# Patient Record
Sex: Male | Born: 1955 | Race: White | Hispanic: No | Marital: Married | Smoking: Never smoker
Health system: Southern US, Community
[De-identification: ages and names within clinical notes are randomized; demographics above are authoritative.]

## PROBLEM LIST (undated history)

## (undated) DIAGNOSIS — T4145XA Adverse effect of unspecified anesthetic, initial encounter: Secondary | ICD-10-CM

## (undated) DIAGNOSIS — M1990 Unspecified osteoarthritis, unspecified site: Secondary | ICD-10-CM

## (undated) DIAGNOSIS — R112 Nausea with vomiting, unspecified: Secondary | ICD-10-CM

## (undated) DIAGNOSIS — I10 Essential (primary) hypertension: Secondary | ICD-10-CM

## (undated) DIAGNOSIS — Z9889 Other specified postprocedural states: Secondary | ICD-10-CM

## (undated) DIAGNOSIS — E785 Hyperlipidemia, unspecified: Secondary | ICD-10-CM

## (undated) DIAGNOSIS — M1712 Unilateral primary osteoarthritis, left knee: Principal | ICD-10-CM

## (undated) DIAGNOSIS — T8859XA Other complications of anesthesia, initial encounter: Secondary | ICD-10-CM

## (undated) DIAGNOSIS — I493 Ventricular premature depolarization: Secondary | ICD-10-CM

## (undated) DIAGNOSIS — R972 Elevated prostate specific antigen [PSA]: Secondary | ICD-10-CM

## (undated) DIAGNOSIS — E119 Type 2 diabetes mellitus without complications: Secondary | ICD-10-CM

## (undated) DIAGNOSIS — K641 Second degree hemorrhoids: Principal | ICD-10-CM

## (undated) HISTORY — DX: Elevated prostate specific antigen (PSA): R97.20

## (undated) HISTORY — PX: EXTERNAL EAR SURGERY: SHX627

## (undated) HISTORY — PX: SIGMOIDOSCOPY: SUR1295

## (undated) HISTORY — DX: Essential (primary) hypertension: I10

## (undated) HISTORY — PX: NASAL FRACTURE SURGERY: SHX718

## (undated) HISTORY — PX: TONSILLECTOMY: SUR1361

## (undated) HISTORY — PX: KNEE SURGERY: SHX244

## (undated) HISTORY — DX: Type 2 diabetes mellitus without complications: E11.9

## (undated) HISTORY — DX: Second degree hemorrhoids: K64.1

## (undated) HISTORY — PX: CARPAL TUNNEL RELEASE: SHX101

## (undated) HISTORY — DX: Unspecified osteoarthritis, unspecified site: M19.90

## (undated) HISTORY — DX: Hyperlipidemia, unspecified: E78.5

---

## 1999-02-08 ENCOUNTER — Other Ambulatory Visit: Admission: RE | Admit: 1999-02-08 | Discharge: 1999-02-08 | Payer: Self-pay | Admitting: *Deleted

## 2006-09-07 ENCOUNTER — Ambulatory Visit: Payer: Self-pay | Admitting: Family Medicine

## 2007-02-17 ENCOUNTER — Ambulatory Visit: Payer: Self-pay | Admitting: Family Medicine

## 2008-12-29 ENCOUNTER — Ambulatory Visit: Payer: Self-pay | Admitting: Family Medicine

## 2011-04-04 NOTE — Assessment & Plan Note (Signed)
[facility]                              [facility] OFFICE NOTE   NAME:[name], Donald [name]                       MRN:          [id number]  DATE:09/07/2006                            DOB:          02-28-1956    This is a 55 year old gentleman here to establish with our practice,  complaining of left groin pain.  There is no history of trauma.  He has  never had a problem like this before.  It came on suddenly about 10 days ago  while he was sitting in a lawn chair watching his daughter play in a soccer  game.  He developed a sudden sharp burning-type pain in the left groin.  At  first it radiated towards the left testicle, but primarily involved the  groin region.  Since then, he has had it intermittently, usually just for a  few seconds at a time, although it may last as long as 20-30 minutes at a  time.  It no longer involves the testicle, but simply feels like a pain in  the groin region.  There have been no lumps or swelling that he could tell.  No urinary symptoms at all.  No penile discharge.  He has taken Tylenol for  it a couple of times, but usually nothing.  He has continued to work as  normally.   PAST MEDICAL HISTORY:  He had surgery to repair a broken nose.  He has had a  tonsillectomy.  He also was worked up for blood in his stools in 1986 with a  sigmoidoscopy.  This revealed simple hemorrhoids only.   HABITS:  He does not use tobacco or alcohol.   ALLERGIES:  None.   MEDICATIONS:  None.   SOCIAL HISTORY:  He is married.  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   FAMILY HISTORY:  Remarkable for arthritis, breast cancer, lung cancer, high  cholesterol, and heart disease.   IMMUNIZATIONS:  He had a tetanus booster in 2005.   OBJECTIVE:  Height 5 feet 5 inches.  Weight 210.  BP 152/92, pulse 92 and  regular, temperature 99.0 degrees.  GENERAL:  He is in  no acute distress.  He gets up and down from the  examination table easily.  In fact, nothing we do in the exam room today  either by himself or with my intervention can reproduce his pain.  He is  overweight.  GENITALIA:  Normal male.  Testicles are not swollen or twisted, or tender at  all.  Examination of both groins revealed no masses, no hernias, and I can  elicit no tenderness at all on either side.   ASSESSMENT AND PLAN:  1. Groin pain, probably muscular in origin.  I think this will get better      over time.  He can rest.  We will begin Lodine 500 mg b.i.d. over the      next couple weeks to see if this can help.  If he is no better in the  next 2 or 3 weeks he is to follow up with me.  2. Health maintenance.  We will have him come by soon for fasting      laboratories and a complete physical exam, since he has not had one in      many years.            ______________________________  Tera Mater Clent Ridges, MD     SAF/MedQ  DD:  09/07/2006  DT:  09/08/2006  Job #:  [id number]

## 2012-02-09 ENCOUNTER — Ambulatory Visit (INDEPENDENT_AMBULATORY_CARE_PROVIDER_SITE_OTHER): Payer: No Typology Code available for payment source | Admitting: Family Medicine

## 2012-02-09 ENCOUNTER — Encounter: Payer: Self-pay | Admitting: Family Medicine

## 2012-02-09 VITALS — BP 130/76 | HR 97 | Temp 98.8°F | Wt 214.0 lb

## 2012-02-09 DIAGNOSIS — J329 Chronic sinusitis, unspecified: Secondary | ICD-10-CM

## 2012-02-09 MED ORDER — HYDROCOD POLST-CHLORPHEN POLST 10-8 MG/5ML PO LQCR: 5.0000 mL | Freq: Two times a day (BID) | ORAL | Status: DC | PRN

## 2012-02-09 MED ORDER — LEVOFLOXACIN 500 MG PO TABS: 500.0000 mg | ORAL_TABLET | Freq: Every day | ORAL | Status: AC

## 2012-02-09 NOTE — Progress Notes (Signed)
  Subjective:    Patient ID: Donald [name], male    DOB: [birth year], 56 y.o.   MRN: [id number]  HPI Here for one week of sinus pressure, PND, ST, and coughing up yellow sputum. On Mucinex D.    Review of Systems  Constitutional: Negative.   HENT: Positive for congestion, postnasal drip and sinus pressure.   Eyes: Negative.   Respiratory: Positive for cough.        Objective:   Physical Exam  Constitutional: He appears well-developed and well-nourished.  HENT:  Right Ear: External ear normal.  Left Ear: External ear normal.  Nose: Nose normal.  Mouth/Throat: Oropharynx is clear and moist. No oropharyngeal exudate.  Eyes: Conjunctivae are normal.  Pulmonary/Chest: Effort normal and breath sounds normal.  Lymphadenopathy:    He has no cervical adenopathy.          Assessment & Plan:  Recheck prn

## 2013-12-20 ENCOUNTER — Telehealth: Payer: Self-pay | Admitting: Family Medicine

## 2013-12-20 NOTE — Telephone Encounter (Signed)
Patient Information:  Caller Name: Donald [name]  Phone: [phone]  Patient: Donald [name], Donald [name]  Gender: Male  DOB: September 13, 1956  Age: 58 Years  PCP: Alysia Penna [facility])  Office Follow Up:  Does the office need to follow up with this patient?: No  Instructions For The Office: N/A  RN Note:  wife says Donald [name] is out of town today; requesting appt 12/21/13  Symptoms  Reason For Call & Symptoms: Wife says he had probable flu at the beginning of January; cough has worsened; seen at the UC over the w/e and placed on Amoxicillin an a cough med; says his left side is hurting from coughing; cough is productive, but unsure of the color  Reviewed Health History In EMR: Yes  Reviewed Medications In EMR: Yes  Reviewed Allergies In EMR: Yes  Reviewed Surgeries / Procedures: Yes  Date of Onset of Symptoms: Unknown  Guideline(s) Used:  Cough  Disposition Per Guideline:   See Today in Office  Reason For Disposition Reached:   Severe coughing spells (e.g., whooping sound after coughing, vomiting after coughing)  Advice Given:  N/A  Patient Will Follow Care Advice:  YES  Appointment Scheduled:  12/21/2013 11:00:00 Appointment Scheduled Provider:  Alysia Penna [facility])

## 2013-12-20 NOTE — Telephone Encounter (Signed)
FYI

## 2013-12-21 ENCOUNTER — Encounter: Payer: Self-pay | Admitting: Family Medicine

## 2013-12-21 ENCOUNTER — Ambulatory Visit (INDEPENDENT_AMBULATORY_CARE_PROVIDER_SITE_OTHER): Payer: Managed Care, Other (non HMO) | Admitting: Family Medicine

## 2013-12-21 VITALS — BP 150/86 | HR 82 | Temp 98.5°F | Ht 65.0 in | Wt 218.0 lb

## 2013-12-21 DIAGNOSIS — J209 Acute bronchitis, unspecified: Secondary | ICD-10-CM

## 2013-12-21 MED ORDER — LEVOFLOXACIN 500 MG PO TABS: 500.0000 mg | ORAL_TABLET | Freq: Every day | ORAL | Status: AC

## 2013-12-21 MED ORDER — HYDROCOD POLST-CHLORPHEN POLST 10-8 MG/5ML PO LQCR: 5.0000 mL | Freq: Two times a day (BID) | ORAL | Status: DC | PRN

## 2013-12-21 NOTE — Progress Notes (Signed)
Pre visit review using our clinic review tool, if applicable. No additional management support is needed unless otherwise documented below in the visit note. 

## 2013-12-21 NOTE — Progress Notes (Signed)
   Subjective:    Patient ID: Donald [name], male    DOB: 1956/08/28, 58 y.o.   MRN: [id number]  HPI Here for 2 weeks of chest congestion and coughing up grayish sputum. No fever. He went to an Urgent Care last week and was given Augmentin. This helped but he is still coughing.    Review of Systems  Constitutional: Negative.   HENT: Negative.   Eyes: Negative.   Respiratory: Positive for cough and chest tightness.        Objective:   Physical Exam  Constitutional: He appears well-developed and well-nourished.  HENT:  Right Ear: External ear normal.  Left Ear: External ear normal.  Nose: Nose normal.  Mouth/Throat: Oropharynx is clear and moist.  Eyes: Conjunctivae are normal.  Pulmonary/Chest: Effort normal. No respiratory distress. He has no wheezes. He has no rales.  Scattered rhonchi   Lymphadenopathy:    He has no cervical adenopathy.          Assessment & Plan:  Recheck prn

## 2016-11-26 ENCOUNTER — Other Ambulatory Visit (INDEPENDENT_AMBULATORY_CARE_PROVIDER_SITE_OTHER): Payer: Managed Care, Other (non HMO)

## 2016-11-26 DIAGNOSIS — Z0000 Encounter for general adult medical examination without abnormal findings: Secondary | ICD-10-CM

## 2016-11-26 DIAGNOSIS — R7989 Other specified abnormal findings of blood chemistry: Secondary | ICD-10-CM

## 2016-11-26 LAB — POC URINALSYSI DIPSTICK (AUTOMATED)
Bilirubin, UA: NEGATIVE
Ketones, UA: NEGATIVE
LEUKOCYTES UA: NEGATIVE
NITRITE UA: NEGATIVE
PH UA: 5.5
Protein, UA: NEGATIVE
Spec Grav, UA: 1.015
UROBILINOGEN UA: 0.2

## 2016-11-26 LAB — PSA: PSA: 4.28 ng/mL — AB (ref 0.10–4.00)

## 2016-11-26 LAB — HEPATIC FUNCTION PANEL
ALBUMIN: 4.2 g/dL (ref 3.5–5.2)
ALK PHOS: 64 U/L (ref 39–117)
ALT: 41 U/L (ref 0–53)
AST: 29 U/L (ref 0–37)
Bilirubin, Direct: 0.1 mg/dL (ref 0.0–0.3)
TOTAL PROTEIN: 6.7 g/dL (ref 6.0–8.3)
Total Bilirubin: 0.7 mg/dL (ref 0.2–1.2)

## 2016-11-26 LAB — BASIC METABOLIC PANEL
BUN: 11 mg/dL (ref 6–23)
CO2: 25 meq/L (ref 19–32)
Calcium: 9.2 mg/dL (ref 8.4–10.5)
Chloride: 104 mEq/L (ref 96–112)
Creatinine, Ser: 0.93 mg/dL (ref 0.40–1.50)
GFR: 88.38 mL/min (ref >60.00)
GLUCOSE: 142 mg/dL — AB (ref 70–99)
POTASSIUM: 3.9 meq/L (ref 3.5–5.1)
SODIUM: 137 meq/L (ref 135–145)

## 2016-11-26 LAB — CBC WITH DIFFERENTIAL/PLATELET
Basophils Absolute: 0 10*3/uL (ref 0.0–0.1)
Basophils Relative: 0.5 % (ref 0.0–3.0)
EOS PCT: 1.5 % (ref 0.0–5.0)
Eosinophils Absolute: 0.1 10*3/uL (ref 0.0–0.7)
HEMATOCRIT: 44.2 % (ref 39.0–52.0)
Hemoglobin: 15.3 g/dL (ref 13.0–17.0)
LYMPHS ABS: 1.7 10*3/uL (ref 0.7–4.0)
LYMPHS PCT: 29.7 % (ref 12.0–46.0)
MCHC: 34.5 g/dL (ref 30.0–36.0)
MCV: 89.3 fl (ref 78.0–100.0)
MONOS PCT: 7 % (ref 3.0–12.0)
Monocytes Absolute: 0.4 10*3/uL (ref 0.1–1.0)
NEUTROS PCT: 61.3 % (ref 43.0–77.0)
Neutro Abs: 3.6 10*3/uL (ref 1.4–7.7)
Platelets: 192 10*3/uL (ref 150.0–400.0)
RBC: 4.95 Mil/uL (ref 4.22–5.81)
RDW: 13.1 % (ref 11.5–15.5)
WBC: 5.9 10*3/uL (ref 4.0–10.5)

## 2016-11-26 LAB — LIPID PANEL
CHOLESTEROL: 194 mg/dL (ref 0–200)
HDL: 29.2 mg/dL — ABNORMAL LOW (ref >39.00)
NonHDL: 164.57
Total CHOL/HDL Ratio: 7
Triglycerides: 354 mg/dL — ABNORMAL HIGH (ref 0.0–149.0)
VLDL: 70.8 mg/dL — AB (ref 0.0–40.0)

## 2016-11-26 LAB — LDL CHOLESTEROL, DIRECT: LDL DIRECT: 98 mg/dL

## 2016-11-26 LAB — TSH: TSH: 2.15 u[IU]/mL (ref 0.35–4.50)

## 2016-12-01 ENCOUNTER — Ambulatory Visit (INDEPENDENT_AMBULATORY_CARE_PROVIDER_SITE_OTHER): Payer: Managed Care, Other (non HMO) | Admitting: Family Medicine

## 2016-12-01 ENCOUNTER — Encounter: Payer: Self-pay | Admitting: Family Medicine

## 2016-12-01 VITALS — BP 190/92 | HR 100 | Temp 98.5°F | Ht 65.0 in | Wt 218.0 lb

## 2016-12-01 DIAGNOSIS — Z0000 Encounter for general adult medical examination without abnormal findings: Secondary | ICD-10-CM | POA: Diagnosis not present

## 2016-12-01 DIAGNOSIS — R739 Hyperglycemia, unspecified: Secondary | ICD-10-CM | POA: Diagnosis not present

## 2016-12-01 DIAGNOSIS — R972 Elevated prostate specific antigen [PSA]: Secondary | ICD-10-CM | POA: Diagnosis not present

## 2016-12-01 LAB — HEMOGLOBIN A1C: Hgb A1c MFr Bld: 8.1 % — ABNORMAL HIGH (ref 4.6–6.5)

## 2016-12-01 MED ORDER — CEPHALEXIN 500 MG PO CAPS: 500.0000 mg | ORAL_CAPSULE | Freq: Four times a day (QID) | ORAL | 0 refills | Status: DC

## 2016-12-01 MED ORDER — LISINOPRIL 20 MG PO TABS: 20.0000 mg | ORAL_TABLET | Freq: Every day | ORAL | 3 refills | Status: DC

## 2016-12-01 NOTE — Progress Notes (Signed)
   Subjective:    Patient ID: Donald [name], male    DOB: 08-07-1956, 61 y.o.   MRN: [id number]  HPI 61 yr old male for a well exam. He feels fine except for infection on the tip of the right 4th finger. 3 weeks ago he shut the finger in a door and it has been sore and swollen since then.    Review of Systems  Constitutional: Negative.   HENT: Negative.   Eyes: Negative.   Respiratory: Negative.   Cardiovascular: Negative.   Gastrointestinal: Negative.   Genitourinary: Negative.   Musculoskeletal: Negative.   Skin: Positive for wound.  Neurological: Negative.   Psychiatric/Behavioral: Negative.        Objective:   Physical Exam  Constitutional: He is oriented to person, place, and time. He appears well-developed and well-nourished. No distress.  HENT:  Head: Normocephalic and atraumatic.  Right Ear: External ear normal.  Left Ear: External ear normal.  Nose: Nose normal.  Mouth/Throat: Oropharynx is clear and moist. No oropharyngeal exudate.  Eyes: Conjunctivae and EOM are normal. Pupils are equal, round, and reactive to light. Right eye exhibits no discharge. Left eye exhibits no discharge. No scleral icterus.  Neck: Neck supple. No JVD present. No tracheal deviation present. No thyromegaly present.  Cardiovascular: Normal rate, regular rhythm, normal heart sounds and intact distal pulses.  Exam reveals no gallop and no friction rub.   No murmur heard. Pulmonary/Chest: Effort normal and breath sounds normal. No respiratory distress. He has no wheezes. He has no rales. He exhibits no tenderness.  Abdominal: Soft. Bowel sounds are normal. He exhibits no distension and no mass. There is no tenderness. There is no rebound and no guarding.  Genitourinary: Rectum normal, prostate normal and penis normal. Rectal exam shows guaiac negative stool. No penile tenderness.  Musculoskeletal: Normal range of motion. He exhibits no edema or tenderness.  Lymphadenopathy:    He has no cervical  adenopathy.  Neurological: He is alert and oriented to person, place, and time. He has normal reflexes. No cranial nerve deficit. He exhibits normal muscle tone. Coordination normal.  Skin: Skin is warm and dry. No rash noted. He is not diaphoretic. No erythema. No pallor.  The area around the nail of the right 4th finger is red and swollen and tender  Psychiatric: He has a normal mood and affect. His behavior is normal. Judgment and thought content normal.          Assessment & Plan:  Well exam. We discussed diet and exercise. Treat the paronychia with Keflex. Set up a colonoscopy. He has HTN so we will treat with Lisinopril 20 mg daily. Recheck in 3-4 weks. His fasting glucose is elevated so we will check an A1c today. Refer to Urology for the elevate PSA. Recheck in 3-4 weeks.  Alysia Penna, MD

## 2016-12-01 NOTE — Progress Notes (Signed)
Pre visit review using our clinic review tool, if applicable. No additional management support is needed unless otherwise documented below in the visit note. 

## 2016-12-08 MED ORDER — METFORMIN HCL 500 MG PO TABS: 500.0000 mg | ORAL_TABLET | Freq: Two times a day (BID) | ORAL | 0 refills | Status: DC

## 2016-12-08 NOTE — Addendum Note (Signed)
Addended by: Aggie Hacker A on: 12/08/2016 02:55 PM   Modules accepted: Orders

## 2017-01-01 ENCOUNTER — Encounter: Payer: Self-pay | Admitting: Family Medicine

## 2017-01-01 ENCOUNTER — Ambulatory Visit (INDEPENDENT_AMBULATORY_CARE_PROVIDER_SITE_OTHER): Payer: Managed Care, Other (non HMO) | Admitting: Family Medicine

## 2017-01-01 VITALS — BP 148/85 | HR 75 | Temp 98.2°F | Ht 65.0 in | Wt 203.0 lb

## 2017-01-01 DIAGNOSIS — I10 Essential (primary) hypertension: Secondary | ICD-10-CM | POA: Insufficient documentation

## 2017-01-01 DIAGNOSIS — E119 Type 2 diabetes mellitus without complications: Secondary | ICD-10-CM

## 2017-01-01 LAB — POCT GLYCOSYLATED HEMOGLOBIN (HGB A1C): Hemoglobin A1C: 7

## 2017-01-01 NOTE — Progress Notes (Signed)
Pre visit review using our clinic review tool, if applicable. No additional management support is needed unless otherwise documented below in the visit note. 

## 2017-01-01 NOTE — Progress Notes (Signed)
   Subjective:    Patient ID: Donald [name], male    DOB: [birth year], 61 y.o.   MRN: [id number]  HPI Here for a one month follow up after a wellness exam where we diagnosed him with HTN and with type 2 DM. His BP that day was 190/92 and his A1c was 8.2. He was started on Lisinopril and Metformin, and he has done well. He has lost 16 lbs and he feels better. His BP at home averages 120s to 130s over 70s to 80s. He has not been checking his glucoses.    Review of Systems  Constitutional: Negative.   Respiratory: Negative.   Cardiovascular: Negative.   Endocrine: Negative.   Neurological: Negative.        Objective:   Physical Exam  Constitutional: He is oriented to person, place, and time. He appears well-developed and well-nourished.  Neck: No thyromegaly present.  Cardiovascular: Normal rate, regular rhythm, normal heart sounds and intact distal pulses.   Pulmonary/Chest: Effort normal and breath sounds normal.  Musculoskeletal: He exhibits no edema.  Lymphadenopathy:    He has no cervical adenopathy.  Neurological: He is alert and oriented to person, place, and time.          Assessment & Plan:  His HTN is now well controlled. He will continue with exercise and weight loss. The diabetes is under better control and he will stay on Metformin for now. We will prescribe him a glucometer to check his glucoses at home daily. We will refer him to Nutrition to learn about diet.  Alysia Penna, MD

## 2017-01-05 ENCOUNTER — Other Ambulatory Visit: Payer: Self-pay | Admitting: Family Medicine

## 2017-01-05 NOTE — Telephone Encounter (Signed)
Pt states Dr Sarajane Jews had put him on this temp to see how it worked, and now needs new rx metFORMIN (GLUCOPHAGE) 500 MG tablet   Ammie Ferrier [address] [place] [address]

## 2017-01-05 NOTE — Telephone Encounter (Signed)
Can we refill this? 

## 2017-01-30 ENCOUNTER — Encounter: Payer: Self-pay | Admitting: Family Medicine

## 2017-02-18 ENCOUNTER — Encounter: Payer: Self-pay | Admitting: Registered"

## 2017-02-18 ENCOUNTER — Encounter: Payer: Managed Care, Other (non HMO) | Attending: Family Medicine | Admitting: Registered"

## 2017-02-18 DIAGNOSIS — E119 Type 2 diabetes mellitus without complications: Secondary | ICD-10-CM | POA: Diagnosis not present

## 2017-02-18 DIAGNOSIS — Z713 Dietary counseling and surveillance: Secondary | ICD-10-CM | POA: Insufficient documentation

## 2017-02-18 NOTE — Progress Notes (Signed)
Diabetes Self-Management Education  Visit Type: First/Initial  Appt. Start Time: 1400 Appt. End Time: 6387  02/19/2017  [name], identified by name and date of birth, is a 61 y.o. male with a diagnosis of Diabetes: Type 2.   ASSESSMENT Pt is accompanied by his wife. Pt states he has made some changes had has lost ~20 lbs since diagnosis. Pt states he has switched out biscuits for oats. Pt states he has had some random low blood sugars hit at 10 am & 3 pm. Wife states she is the one who cooks, but she prefers to go out.  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      Diabetes Self-Management Education - 02/18/17 1407      Visit Information   Visit Type First/Initial     Initial Visit   Diabetes Type Type 2   Are you currently following a meal plan? No   Are you taking your medications as prescribed? Yes   Date Diagnosed Jan 2018     Health Coping   How would you rate your overall health? Good     Psychosocial Assessment   Patient Belief/Attitude about Diabetes Motivated to manage diabetes   How often do you need to have someone help you when you read instructions, pamphlets, or other written materials from your doctor or pharmacy? 1 - Never   What is the last grade level you completed in school? 12     Complications   Last HgB A1C per patient/outside source 7.1 %   How often do you check your blood sugar? 1-2 times/day   Fasting Blood glucose range (mg/dL) --  morning   Have you had a dilated eye exam in the past 12 months? Yes   Have you had a dental exam in the past 12 months? Yes   Are you checking your feet? No     Dietary Intake   Breakfast oatmeal, cinamon, honey, water, a little juice OR 1/2 banana OR cheerios OR occassionally biscuit, eggs, bacon   Snack (morning) mixed nuts   Lunch salad, 1 T poppyseed dressing, fruit, tuna fish, ww bread [place] sandwich, water   Snack (afternoon) apple, grapes   Dinner steak OR  baked meat fish, vegetable, potatoes OR occassionally pizza or fried 1x week   Snack (evening) none   Beverage(s) pepsi zero occassionally, water, unsweet tea with splenda     Exercise   Exercise Type ADL's   How many days per week to you exercise? 0   How many minutes per day do you exercise? 0   Total minutes per week of exercise 0     Patient Education   Previous Diabetes Education No   Disease state  Definition of diabetes, type 1 and 2, and the diagnosis of diabetes;Factors that contribute to the development of diabetes   Nutrition management  Role of diet in the treatment of diabetes and the relationship between the three main macronutrients and blood glucose level;Food label reading, portion sizes and measuring food.;Carbohydrate counting   Monitoring Interpreting lab values - A1C, lipid, urine microalbumina.;Identified appropriate SMBG and/or A1C goals.   Acute complications Taught treatment of hypoglycemia - the 15 rule.   Chronic complications Relationship between chronic complications and blood glucose control     Individualized Goals (developed by patient)   Nutrition General guidelines for healthy choices and portions discussed   Physical Activity Exercise 3-5 times per week     Outcomes  Expected Outcomes Demonstrated interest in learning. Expect positive outcomes   Future DMSE PRN   Program Status Completed      Individualized Plan for Diabetes Self-Management Training:   Learning Objective:  Patient will have a greater understanding of diabetes self-management. Patient education plan is to attend individual and/or group sessions per assessed needs and concerns.   Plan:   Patient Instructions  Plan:  Aim for 3-4 Carb Choices per meal (45-60 grams)  Aim for 0-2 Carbs per snack if hungry  Include protein in moderation with your meals and snacks Consider reading food labels for Total Carbohydrate and sat Fat Grams of foods Consider increasing your activity level  by 15 minutes daily as tolerated Continue checking BG at alternate times per day as directed by MD  Continue taking medication as directed by MD [email or web address] check for sodium, carbohydrates If using salt substitute, avoid potassium    Expected Outcomes:  Demonstrated interest in learning. Expect positive outcomes  Education material provided: Living Well with Diabetes, Food label handouts, A1C conversion sheet, My Plate, Snack sheet and Carbohydrate counting sheet  If problems or questions, patient to contact team via:  Phone and Email  Future DSME appointment: PRN

## 2017-02-18 NOTE — Patient Instructions (Signed)
Plan:  Aim for 3-4 Carb Choices per meal (45-60 grams)  Aim for 0-2 Carbs per snack if hungry  Include protein in moderation with your meals and snacks Consider reading food labels for Total Carbohydrate and sat Fat Grams of foods Consider increasing your activity level by 15 minutes daily as tolerated Continue checking BG at alternate times per day as directed by MD  Continue taking medication as directed by MD [email or web address] check for sodium, carbohydrates If using salt substitute, avoid potassium

## 2017-02-19 ENCOUNTER — Ambulatory Visit: Payer: Managed Care, Other (non HMO) | Admitting: Registered"

## 2017-03-30 ENCOUNTER — Ambulatory Visit: Payer: Managed Care, Other (non HMO) | Admitting: Family Medicine

## 2017-04-05 ENCOUNTER — Ambulatory Visit (HOSPITAL_COMMUNITY)
Admission: EM | Admit: 2017-04-05 | Discharge: 2017-04-05 | Disposition: A | Discharge status: Home / Self Care | Payer: Managed Care, Other (non HMO) | Attending: Family Medicine | Admitting: Family Medicine

## 2017-04-05 ENCOUNTER — Encounter (HOSPITAL_COMMUNITY): Payer: Self-pay | Admitting: Emergency Medicine

## 2017-04-05 DIAGNOSIS — M79605 Pain in left leg: Secondary | ICD-10-CM | POA: Diagnosis not present

## 2017-04-05 DIAGNOSIS — S76309A Unspecified injury of muscle, fascia and tendon of the posterior muscle group at thigh level, unspecified thigh, initial encounter: Secondary | ICD-10-CM | POA: Diagnosis not present

## 2017-04-05 MED ORDER — OXYCODONE-ACETAMINOPHEN 5-325 MG PO TABS: 1.0000 | ORAL_TABLET | Freq: Three times a day (TID) | ORAL | 0 refills | Status: DC | PRN

## 2017-04-05 NOTE — Discharge Instructions (Signed)
It was nice seeing you today. You likely have hamstring muscle injury. To rule out rupture you will need to get an MRI done. Please see PCP or go to the ED for this. In the mean time use pain medicine prescribed as needed with warm compression. F/U as needed.

## 2017-04-05 NOTE — ED Triage Notes (Signed)
The patient presented to the [facility] with a complaint of upper left leg pain x 2 days. The patient reported swinging a shovel yesterday and it pulling his back.

## 2017-04-05 NOTE — ED Provider Notes (Signed)
[facility]    CSN: [id number] Arrival date & time: 04/05/17  1428     History   Chief Complaint Chief Complaint  Patient presents with  . Leg Pain    HPI Donald [name] is a 61 y.o. male.   The history is provided by the patient. No language interpreter was used.  Leg Pain  Location:  Leg Time since incident:  24 hours (Late yesterday afternoon) Lower extremity injury: he was trying to lift a shavel at home and he felt a snap on his harmstring muscle.   Leg location:  R upper leg Pain details:    Quality:  Aching   Radiates to:  Does not radiate   Severity:  Moderate (8/10)   Onset quality:  Sudden   Timing:  Constant   Progression:  Waxing and waning Relieved by: certain position. Exacerbated by: Movement. Ineffective treatments:  Ice and NSAIDs Associated symptoms: no fever, no muscle weakness and no swelling   Associated symptoms comment:  No bruising, no redness  HTN: He is compliant with his meds.  Past Medical History:  Diagnosis Date  . Hyperlipidemia   . Hypertension     Patient Active Problem List   Diagnosis Date Noted  . HTN (hypertension) 01/01/2017  . Type 2 diabetes mellitus without complications ([place]) 41/74/0814    Past Surgical History:  Procedure Laterality Date  . broken nose    . SIGMOIDOSCOPY    . TONSILLECTOMY         Home Medications    Prior to Admission medications   Medication Sig Start Date End Date Taking? Authorizing Provider  lisinopril (PRINIVIL,ZESTRIL) 20 MG tablet Take 1 tablet (20 mg total) by mouth daily. 12/01/16  Yes Laurey Morale, MD  metFORMIN (GLUCOPHAGE) 500 MG tablet TAKE ONE TABLET BY MOUTH TWICE A DAY WITH A MEAL 01/05/17  Yes Laurey Morale, MD    Family History Family History  Problem Relation Age of Onset  . Arthritis Unknown   . Cancer Unknown        breast & lung  . Coronary artery disease Unknown   . Hyperlipidemia Unknown     Social History Social History  Substance Use  Topics  . Smoking status: Never Smoker  . Smokeless tobacco: Never Used  . Alcohol use No     Allergies   Patient has no known allergies.   Review of Systems Review of Systems  Constitutional: Negative for fever.  Respiratory: Negative.   Cardiovascular: Negative.   Musculoskeletal: Positive for arthralgias and myalgias. Negative for joint swelling.  All other systems reviewed and are negative.    Physical Exam Triage Vital Signs ED Triage Vitals  Enc Vitals Group     BP 04/05/17 1450 (!) 159/84     Pulse Rate 04/05/17 1450 88     Resp 04/05/17 1450 18     Temp 04/05/17 1450 98.2 F (36.8 C)     Temp Source 04/05/17 1450 Oral     SpO2 04/05/17 1450 99 %     Weight --      Height --      Head Circumference --      Peak Flow --      Pain Score 04/05/17 1449 8     Pain Loc --      Pain Edu? --      Excl. in [place]? --    No data found.   Updated Vital Signs BP (!) 159/84 (BP Location:  Right Arm)   Pulse 88   Temp 98.2 F (36.8 C) (Oral)   Resp 18   SpO2 99%   Visual Acuity Right Eye Distance:   Left Eye Distance:   Bilateral Distance:    Right Eye Near:   Left Eye Near:    Bilateral Near:     Physical Exam  Constitutional: He appears well-developed. No distress.  Cardiovascular: Normal rate, regular rhythm and normal heart sounds.   No murmur heard. Pulmonary/Chest: Effort normal and breath sounds normal. No respiratory distress. He has no wheezes.  Musculoskeletal:       Left upper leg: He exhibits tenderness and swelling. He exhibits no edema.       Legs: Nursing note and vitals reviewed.    UC Treatments / Results  Labs (all labs ordered are listed, but only abnormal results are displayed) Labs Reviewed - No data to display  EKG  EKG Interpretation None       Radiology No results found.  Procedures Procedures (including critical care time)  Medications Ordered in UC Medications - No data to display   Initial Impression /  Assessment and Plan / UC Course  I have reviewed the triage vital signs and the nursing notes.  Pertinent labs & imaging results that were available during my care of the patient were reviewed by me and considered in my medical decision making (see chart for details).  Clinical Course as of Apr 06 1603  Sun Apr 05, 2017  1559 Likely muscle strain vs ligament tear given hx of meniscus tear in the past, risk for tear is higher. I recommended warm compression and percocet prescribed prn pain. I recommended PCP visit tomorrow to schedule MRI to r/o ligament tear or rupture. Return precaution discussed.  [KE]  1603 BP elevated. He has HTN and took his meds today,. BP likely slightly elevated due to pain. F/U with PCP for further management  [KE]    Clinical Course User Index [KE] Kinnie Feil, MD      Final Clinical Impressions(s) / UC Diagnoses   Final diagnoses:  None    New Prescriptions New Prescriptions   No medications on file     Kinnie Feil, MD 04/05/17 1606

## 2017-12-03 ENCOUNTER — Telehealth: Payer: Self-pay | Admitting: Family Medicine

## 2017-12-03 MED ORDER — LISINOPRIL 20 MG PO TABS: 20.0000 mg | ORAL_TABLET | Freq: Every day | ORAL | 0 refills | Status: DC

## 2017-12-03 NOTE — Telephone Encounter (Signed)
Sent to PCP for the [facility] to fill

## 2017-12-03 NOTE — Telephone Encounter (Signed)
Called and spoke with pt. Pt advised that Rx has been sent.

## 2017-12-03 NOTE — Telephone Encounter (Signed)
Call in #30 with no rf  

## 2017-12-03 NOTE — Telephone Encounter (Signed)
Copied from [place] [id number]. Topic: Quick Communication - See Telephone Encounter >> Dec 03, 2017  8:30 AM Ether Griffins B wrote: CRM for notification. See Telephone encounter for:  Pt left travel bag out of town and is out of his BP meds he is needing a refill on lisinopril. Pt has cpe scheduled on 12/11/17 12/03/17.

## 2017-12-11 ENCOUNTER — Encounter: Payer: Self-pay | Admitting: Family Medicine

## 2017-12-11 ENCOUNTER — Ambulatory Visit (INDEPENDENT_AMBULATORY_CARE_PROVIDER_SITE_OTHER): Payer: Managed Care, Other (non HMO) | Admitting: Family Medicine

## 2017-12-11 ENCOUNTER — Encounter: Payer: Self-pay | Admitting: Internal Medicine

## 2017-12-11 VITALS — BP 160/90 | HR 86 | Temp 98.3°F | Ht 65.0 in | Wt 206.2 lb

## 2017-12-11 DIAGNOSIS — M1990 Unspecified osteoarthritis, unspecified site: Secondary | ICD-10-CM | POA: Insufficient documentation

## 2017-12-11 DIAGNOSIS — E119 Type 2 diabetes mellitus without complications: Secondary | ICD-10-CM

## 2017-12-11 DIAGNOSIS — M150 Primary generalized (osteo)arthritis: Secondary | ICD-10-CM | POA: Diagnosis not present

## 2017-12-11 DIAGNOSIS — Z0000 Encounter for general adult medical examination without abnormal findings: Secondary | ICD-10-CM

## 2017-12-11 DIAGNOSIS — R972 Elevated prostate specific antigen [PSA]: Secondary | ICD-10-CM | POA: Diagnosis not present

## 2017-12-11 DIAGNOSIS — G5601 Carpal tunnel syndrome, right upper limb: Secondary | ICD-10-CM

## 2017-12-11 DIAGNOSIS — I10 Essential (primary) hypertension: Secondary | ICD-10-CM | POA: Diagnosis not present

## 2017-12-11 DIAGNOSIS — M159 Polyosteoarthritis, unspecified: Secondary | ICD-10-CM

## 2017-12-11 DIAGNOSIS — Z0001 Encounter for general adult medical examination with abnormal findings: Secondary | ICD-10-CM | POA: Diagnosis not present

## 2017-12-11 MED ORDER — LISINOPRIL 20 MG PO TABS
20.0000 mg | ORAL_TABLET | Freq: Every day | ORAL | 3 refills | Status: DC
Start: 2017-12-11 — End: 2018-12-14

## 2017-12-11 MED ORDER — DICLOFENAC SODIUM 75 MG PO TBEC: 75.0000 mg | DELAYED_RELEASE_TABLET | Freq: Two times a day (BID) | ORAL | 3 refills | Status: DC

## 2017-12-11 MED ORDER — GLIPIZIDE 5 MG PO TABS: 5.0000 mg | ORAL_TABLET | Freq: Two times a day (BID) | ORAL | 3 refills | Status: DC

## 2017-12-11 MED ORDER — AMLODIPINE BESYLATE 5 MG PO TABS: 5.0000 mg | ORAL_TABLET | Freq: Every day | ORAL | 3 refills | Status: DC

## 2017-12-11 NOTE — Progress Notes (Signed)
   Subjective:    Patient ID: Donald [name], male    DOB: [birth year], 62 y.o.   MRN: [id number]  HPI Here for a well exam. He has a few complaints today. First he has had diarrhea ever since he started on Metformin. He checks his A1c at home periodically and the last one read 7.1. Also for 3 months he has had intermittent numbness and tingling in the thumb and 2nd and 3rd fingers of the right hand. No swelling or pain. His BP has been running a little high at home. He sees Dr. Louis Meckel twice a year for an elevated PSA. The last one was 4.33 last summer. He has not had a colonoscopy yet. He has diffuse arthritis pains and he takes Aleve twice a day, but this is not working very well.    Review of Systems  Constitutional: Negative.   HENT: Negative.   Eyes: Negative.   Respiratory: Negative.   Cardiovascular: Negative.   Gastrointestinal: Positive for diarrhea. Negative for abdominal distention, abdominal pain, anal bleeding, blood in stool, constipation, nausea, rectal pain and vomiting.  Genitourinary: Negative.   Musculoskeletal: Negative.   Skin: Negative.   Neurological: Positive for numbness. Negative for dizziness, tremors, seizures, syncope, facial asymmetry, speech difficulty, weakness, light-headedness and headaches.  Psychiatric/Behavioral: Negative.        Objective:   Physical Exam  Constitutional: He is oriented to person, place, and time. He appears well-developed and well-nourished. No distress.  HENT:  Head: Normocephalic and atraumatic.  Right Ear: External ear normal.  Left Ear: External ear normal.  Nose: Nose normal.  Mouth/Throat: Oropharynx is clear and moist. No oropharyngeal exudate.  Eyes: Conjunctivae and EOM are normal. Pupils are equal, round, and reactive to light. Right eye exhibits no discharge. Left eye exhibits no discharge. No scleral icterus.  Neck: Neck supple. No JVD present. No tracheal deviation present. No thyromegaly present.  Cardiovascular:  Normal rate, regular rhythm, normal heart sounds and intact distal pulses. Exam reveals no gallop and no friction rub.  No murmur heard. Pulmonary/Chest: Effort normal and breath sounds normal. No respiratory distress. He has no wheezes. He has no rales. He exhibits no tenderness.  Abdominal: Soft. Bowel sounds are normal. He exhibits no distension and no mass. There is no tenderness. There is no rebound and no guarding.  Musculoskeletal: Normal range of motion. He exhibits no edema or tenderness.  Lymphadenopathy:    He has no cervical adenopathy.  Neurological: He is alert and oriented to person, place, and time. He has normal reflexes. No cranial nerve deficit. He exhibits normal muscle tone. Coordination normal.  Skin: Skin is warm and dry. No rash noted. He is not diaphoretic. No erythema. No pallor.  Psychiatric: He has a normal mood and affect. His behavior is normal. Judgment and thought content normal.          Assessment & Plan:  Well exam. We discussed diet and exercise. Set up fasting labs for next week. We will repeat a PSA and he will take this to Dr. Louis Meckel to discuss (he will see him in 3 weeks). His HTN is not well controlled so we will add Amlodipine 5 mg daily to the Lisinopril. Due to diarrhea we will stop Metformin and start on Glipizide 5  mg bid. For the arthritis we will try Diclofenac 75 mg bid. Set up a colonoscopy. Alysia Penna, MD

## 2017-12-15 ENCOUNTER — Other Ambulatory Visit (INDEPENDENT_AMBULATORY_CARE_PROVIDER_SITE_OTHER): Payer: Managed Care, Other (non HMO)

## 2017-12-15 DIAGNOSIS — E119 Type 2 diabetes mellitus without complications: Secondary | ICD-10-CM | POA: Diagnosis not present

## 2017-12-15 DIAGNOSIS — Z0000 Encounter for general adult medical examination without abnormal findings: Secondary | ICD-10-CM | POA: Diagnosis not present

## 2017-12-15 LAB — BASIC METABOLIC PANEL
BUN: 24 mg/dL — AB (ref 6–23)
CALCIUM: 9.1 mg/dL (ref 8.4–10.5)
CO2: 28 mEq/L (ref 19–32)
CREATININE: 1.28 mg/dL (ref 0.40–1.50)
Chloride: 104 mEq/L (ref 96–112)
GFR: 60.91 mL/min (ref >60.00)
Glucose, Bld: 111 mg/dL — ABNORMAL HIGH (ref 70–99)
Potassium: 4.2 mEq/L (ref 3.5–5.1)
Sodium: 139 mEq/L (ref 135–145)

## 2017-12-15 LAB — CBC WITH DIFFERENTIAL/PLATELET
BASOS PCT: 0.4 % (ref 0.0–3.0)
Basophils Absolute: 0 10*3/uL (ref 0.0–0.1)
EOS ABS: 0.1 10*3/uL (ref 0.0–0.7)
EOS PCT: 2.1 % (ref 0.0–5.0)
HEMATOCRIT: 41.3 % (ref 39.0–52.0)
Hemoglobin: 14.3 g/dL (ref 13.0–17.0)
LYMPHS PCT: 32.5 % (ref 12.0–46.0)
Lymphs Abs: 1.9 10*3/uL (ref 0.7–4.0)
MCHC: 34.5 g/dL (ref 30.0–36.0)
MCV: 88.9 fl (ref 78.0–100.0)
Monocytes Absolute: 0.4 10*3/uL (ref 0.1–1.0)
Monocytes Relative: 7.5 % (ref 3.0–12.0)
NEUTROS ABS: 3.4 10*3/uL (ref 1.4–7.7)
Neutrophils Relative %: 57.5 % (ref 43.0–77.0)
PLATELETS: 187 10*3/uL (ref 150.0–400.0)
RBC: 4.64 Mil/uL (ref 4.22–5.81)
RDW: 12.9 % (ref 11.5–15.5)
WBC: 5.8 10*3/uL (ref 4.0–10.5)

## 2017-12-15 LAB — POC URINALSYSI DIPSTICK (AUTOMATED)
BILIRUBIN UA: NEGATIVE
GLUCOSE UA: NEGATIVE
KETONES UA: NEGATIVE
Leukocytes, UA: NEGATIVE
Nitrite, UA: NEGATIVE
Protein, UA: NEGATIVE
RBC UA: NEGATIVE
Spec Grav, UA: 1.025 (ref 1.010–1.025)
Urobilinogen, UA: 0.2 E.U./dL
pH, UA: 6.5 (ref 5.0–8.0)

## 2017-12-15 LAB — LIPID PANEL
CHOLESTEROL: 181 mg/dL (ref 0–200)
HDL: 28.1 mg/dL — ABNORMAL LOW (ref >39.00)
NONHDL: 152.92
TRIGLYCERIDES: 287 mg/dL — AB (ref 0.0–149.0)
Total CHOL/HDL Ratio: 6
VLDL: 57.4 mg/dL — ABNORMAL HIGH (ref 0.0–40.0)

## 2017-12-15 LAB — HEPATIC FUNCTION PANEL
ALK PHOS: 48 U/L (ref 39–117)
ALT: 20 U/L (ref 0–53)
AST: 16 U/L (ref 0–37)
Albumin: 4.3 g/dL (ref 3.5–5.2)
Bilirubin, Direct: 0.1 mg/dL (ref 0.0–0.3)
Total Bilirubin: 0.6 mg/dL (ref 0.2–1.2)
Total Protein: 6.7 g/dL (ref 6.0–8.3)

## 2017-12-15 LAB — HEMOGLOBIN A1C: Hgb A1c MFr Bld: 6.4 % (ref 4.6–6.5)

## 2017-12-15 LAB — TSH: TSH: 0.04 u[IU]/mL — ABNORMAL LOW (ref 0.35–4.50)

## 2017-12-15 LAB — PSA: PSA: 4.47 ng/mL — AB (ref 0.10–4.00)

## 2017-12-15 LAB — LDL CHOLESTEROL, DIRECT: Direct LDL: 104 mg/dL

## 2017-12-28 ENCOUNTER — Telehealth: Payer: Self-pay | Admitting: *Deleted

## 2017-12-28 DIAGNOSIS — L723 Sebaceous cyst: Secondary | ICD-10-CM

## 2017-12-28 NOTE — Telephone Encounter (Signed)
Pt stated that Dr. Sarajane Jews took a look at the bump on his back at his physical and when he checked it out Dr. Sarajane Jews suggested to have it drainage at our office or have it completed removed pt wasn't sure if he would ne a dermatology referal or surgeon referral. But the bump is starting to bother him. Sent to PCP to set up referral

## 2017-12-28 NOTE — Telephone Encounter (Signed)
Copied from [place]. Topic: Referral - Request >> Dec 28, 2017 12:22 PM Burnis Medin, NT wrote: Reason for CRM: Patient called and wanted to see if the doctor could give him a referral  to see a dermatologist.

## 2017-12-28 NOTE — Telephone Encounter (Signed)
He has a sebaceous cyst. I have referred him to Surgery for this.

## 2017-12-29 NOTE — Telephone Encounter (Signed)
Called and spoke with pt. Pt advised and voiced understanding.  

## 2018-02-05 ENCOUNTER — Encounter: Payer: Self-pay | Admitting: Internal Medicine

## 2018-02-05 ENCOUNTER — Ambulatory Visit (AMBULATORY_SURGERY_CENTER): Payer: Self-pay | Admitting: *Deleted

## 2018-02-05 ENCOUNTER — Other Ambulatory Visit: Payer: Self-pay

## 2018-02-05 VITALS — Ht 65.0 in | Wt 212.6 lb

## 2018-02-05 DIAGNOSIS — Z1211 Encounter for screening for malignant neoplasm of colon: Secondary | ICD-10-CM

## 2018-02-05 NOTE — Progress Notes (Signed)
No egg or soy allergy known to patient  Some issues with past , no intubation problems  Back in 60's and had severe N&V  Has been given anti nausea meds prior to having knee surgery No diet pills per patient No home 02 use per patient  No blood thinners per patient  Pt denies issues with constipation  No A fib or A flutter  EMMI video sent to pt's e mail

## 2018-02-10 ENCOUNTER — Telehealth: Payer: Self-pay | Admitting: Family Medicine

## 2018-02-10 DIAGNOSIS — R200 Anesthesia of skin: Secondary | ICD-10-CM

## 2018-02-10 DIAGNOSIS — R202 Paresthesia of skin: Principal | ICD-10-CM

## 2018-02-10 NOTE — Telephone Encounter (Signed)
Sent to PCP to place referral  

## 2018-02-10 NOTE — Telephone Encounter (Signed)
Copied from [id number]. 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 He told him to call back if he had anymore problems out of it & he would refer him somewhere. He did not remember where. He is asking for the referral now as the wrist is giving him a problem. Call back [phone]

## 2018-02-12 NOTE — Telephone Encounter (Signed)
Called pt and left a detailed VM.

## 2018-02-12 NOTE — Telephone Encounter (Signed)
The next step is to get a nerve conduction test on the right arm and hand. I put in the order and the facility will contact him about it

## 2018-02-12 NOTE — Addendum Note (Signed)
Addended by: Alysia Penna A on: 02/12/2018 08:36 AM   Modules accepted: Orders

## 2018-02-15 HISTORY — PX: HEMORRHOID BANDING: SHX5850

## 2018-02-18 ENCOUNTER — Encounter: Payer: Self-pay | Admitting: Neurology

## 2018-02-19 ENCOUNTER — Other Ambulatory Visit: Payer: Self-pay | Admitting: *Deleted

## 2018-02-19 ENCOUNTER — Other Ambulatory Visit: Payer: Self-pay

## 2018-02-19 ENCOUNTER — Ambulatory Visit (AMBULATORY_SURGERY_CENTER): Payer: Managed Care, Other (non HMO) | Admitting: Internal Medicine

## 2018-02-19 ENCOUNTER — Encounter: Payer: Self-pay | Admitting: Internal Medicine

## 2018-02-19 VITALS — BP 115/73 | HR 81 | Temp 99.1°F | Resp 13 | Ht 65.0 in | Wt 212.0 lb

## 2018-02-19 DIAGNOSIS — Z1211 Encounter for screening for malignant neoplasm of colon: Secondary | ICD-10-CM | POA: Diagnosis present

## 2018-02-19 DIAGNOSIS — K635 Polyp of colon: Secondary | ICD-10-CM

## 2018-02-19 DIAGNOSIS — R200 Anesthesia of skin: Secondary | ICD-10-CM

## 2018-02-19 DIAGNOSIS — D123 Benign neoplasm of transverse colon: Secondary | ICD-10-CM

## 2018-02-19 DIAGNOSIS — D128 Benign neoplasm of rectum: Secondary | ICD-10-CM

## 2018-02-19 DIAGNOSIS — K621 Rectal polyp: Secondary | ICD-10-CM | POA: Diagnosis not present

## 2018-02-19 HISTORY — PX: COLONOSCOPY: SHX174

## 2018-02-19 MED ORDER — SODIUM CHLORIDE 0.9 % IV SOLN: 500.0000 mL | Freq: Once | INTRAVENOUS | Status: DC

## 2018-02-19 NOTE — Progress Notes (Signed)
Report to PACU, RN, vss, BBS= Clear.  

## 2018-02-19 NOTE — Patient Instructions (Addendum)
I found and removed 2 small polyps that look benign. I will let you know pathology results and when to have another routine colonoscopy by mail and/or My Chart.  You also have diverticulosis - thickened muscle rings and pouches in the colon wall. Please read the handout about this condition.  I appreciate the opportunity to care for you. Gatha Mayer, MD, [facility]  Handouts given: Polyps, Diverticulosis , Hemorrhoids and Hemorrhoidal Banding.   YOU HAD AN ENDOSCOPIC PROCEDURE TODAY AT [facility]:   Refer to the procedure report that was given to you for any specific questions about what was found during the examination.  If the procedure report does not answer your questions, please call your gastroenterologist to clarify.  If you requested that your care partner not be given the details of your procedure findings, then the procedure report has been included in a sealed envelope for you to review at your convenience later.  YOU SHOULD EXPECT: Some feelings of bloating in the abdomen. Passage of more gas than usual.  Walking can help get rid of the air that was put into your GI tract during the procedure and reduce the bloating. If you had a lower endoscopy (such as a colonoscopy or flexible sigmoidoscopy) you may notice spotting of blood in your stool or on the toilet paper. If you underwent a bowel prep for your procedure, you may not have a normal bowel movement for a few days.  Please Note:  You might notice some irritation and congestion in your nose or some drainage.  This is from the oxygen used during your procedure.  There is no need for concern and it should clear up in a day or so.  SYMPTOMS TO REPORT IMMEDIATELY:   Following lower endoscopy (colonoscopy or flexible sigmoidoscopy):  Excessive amounts of blood in the stool  Significant tenderness or worsening of abdominal pains  Swelling of the abdomen that is new, acute  Fever of 100F or higher   For  urgent or emergent issues, a gastroenterologist can be reached at any hour by calling [phone].   DIET:  We do recommend a small meal at first, but then you may proceed to your regular diet.  Drink plenty of fluids but you should avoid alcoholic beverages for 24 hours.  ACTIVITY:  You should plan to take it easy for the rest of today and you should NOT DRIVE or use heavy machinery until tomorrow (because of the sedation medicines used during the test).    FOLLOW UP: Our staff will call the number listed on your records the next business day following your procedure to check on you and address any questions or concerns that you may have regarding the information given to you following your procedure. If we do not reach you, we will leave a message.  However, if you are feeling well and you are not experiencing any problems, there is no need to return our call.  We will assume that you have returned to your regular daily activities without incident.  If any biopsies were taken you will be contacted by phone or by letter within the next 1-3 weeks.  Please call us at [phone] if you have not heard about the biopsies in 3 weeks.    SIGNATURES/CONFIDENTIALITY: You and/or your care partner have signed paperwork which will be entered into your electronic medical record.  These signatures attest to the fact that that the information above on your After Visit Summary  has been reviewed and is understood.  Full responsibility of the confidentiality of this discharge information lies with you and/or your care-partner. 

## 2018-02-19 NOTE — Progress Notes (Signed)
Called to room to assist during endoscopic procedure.  Patient ID and intended procedure confirmed with present staff. Received instructions for my participation in the procedure from the performing physician.  

## 2018-02-19 NOTE — Op Note (Signed)
[place] Patient Name: Donald [name] Procedure Date: 02/19/2018 11:12 AM MRN: [id number] Endoscopist: Gatha Mayer , MD Age: 62 Referring MD:  Date of Birth: September 09, 1956 Gender: Male Account #: [id number] Procedure:                Colonoscopy Indications:              Screening for colorectal malignant neoplasm Medicines:                Propofol per Anesthesia, Monitored Anesthesia Care Procedure:                Pre-Anesthesia Assessment:                           - Prior to the procedure, a History and Physical                            was performed, and patient medications and                            allergies were reviewed. The patient's tolerance of                            previous anesthesia was also reviewed. The risks                            and benefits of the procedure and the sedation                            options and risks were discussed with the patient.                            All questions were answered, and informed consent                            was obtained. Prior Anticoagulants: The patient has                            taken no previous anticoagulant or antiplatelet                            agents. ASA Grade Assessment: II - A patient with                            mild systemic disease. After reviewing the risks                            and benefits, the patient was deemed in                            satisfactory condition to undergo the procedure.                           After obtaining informed consent, the colonoscope  was passed under direct vision. Throughout the                            procedure, the patient's blood pressure, pulse, and                            oxygen saturations were monitored continuously. The                            Colonoscope was introduced through the anus and                            advanced to the the cecum, identified by   appendiceal orifice and ileocecal valve. The                            colonoscopy was performed without difficulty. The                            patient tolerated the procedure well. The quality                            of the bowel preparation was good. The ileocecal                            valve, appendiceal orifice, and rectum were                            photographed. The bowel preparation used was                            Miralax. Scope In: 11:15:11 AM Scope Out: 11:30:44 AM Scope Withdrawal Time: 0 hours 11 minutes 15 seconds  Total Procedure Duration: 0 hours 15 minutes 33 seconds  Findings:                 The perianal and digital rectal examinations were                            normal. Pertinent negatives include normal prostate                            (size, shape, and consistency).                           A diminutive polyp was found in the rectum. The                            polyp was sessile. The polyp was removed with a                            cold snare. Resection and retrieval were complete.                            Verification of patient identification for the  specimen was done. Estimated blood loss was minimal.                           A 1 to 2 mm polyp was found in the transverse                            colon. The polyp was sessile. The polyp was removed                            with a cold biopsy forceps. Resection and retrieval                            were complete. Verification of patient                            identification for the specimen was done. Estimated                            blood loss was minimal.                           Many diverticula were found in the entire colon.                            Left > right                           External and internal hemorrhoids were found during                            retroflexion.                           The exam was otherwise  without abnormality on                            direct and retroflexion views. Complications:            No immediate complications. Estimated Blood Loss:     Estimated blood loss was minimal. Impression:               - One diminutive polyp in the rectum, removed with                            a cold snare. Resected and retrieved.                           - One 1 to 2 mm polyp in the transverse colon,                            removed with a cold biopsy forceps. Resected and                            retrieved.                           -  Severe diverticulosis in the entire examined                            colon.                           - External and internal hemorrhoids.                           - The examination was otherwise normal on direct                            and retroflexion views. Recommendation:           - Patient has a contact number available for                            emergencies. The signs and symptoms of potential                            delayed complications were discussed with the                            patient. Return to normal activities tomorrow.                            Written discharge instructions were provided to the                            patient.                           - Resume previous diet.                           - Continue present medications.                           - Repeat colonoscopy is recommended. The                            colonoscopy date will be determined after pathology                            results from today's exam become available for                            review. Gatha Mayer, MD 02/19/2018 11:36:14 AM This report has been signed electronically.

## 2018-02-22 ENCOUNTER — Telehealth: Payer: Self-pay

## 2018-02-22 NOTE — Telephone Encounter (Signed)
  Follow up Call-  Call back number 02/19/2018  Post procedure Call Back phone  # [phone]  Permission to leave phone message Yes  Some recent data might be hidden     Patient questions:  Do you have a fever, pain , or abdominal swelling? No. Pain Score  0 *  Have you tolerated food without any problems? Yes.    Have you been able to return to your normal activities? Yes.    Do you have any questions about your discharge instructions: Diet   No. Medications  No. Follow up visit  No.  Do you have questions or concerns about your Care? No.  Actions: * If pain score is 4 or above: No action needed, pain <4.

## 2018-02-22 NOTE — Telephone Encounter (Signed)
Left message for patient to call back  

## 2018-02-22 NOTE — Telephone Encounter (Signed)
-----   Message from Gatha Mayer, MD sent at 02/19/2018 12:39 PM EDT ----- Regarding: needs a banding appt Please contact him and arrange a hemorrhoid banding appointment - did colonoscopy this AM and discussed this afterward

## 2018-02-23 NOTE — Telephone Encounter (Signed)
Patient is scheduled for 03/01/18 11:30

## 2018-03-01 ENCOUNTER — Ambulatory Visit (INDEPENDENT_AMBULATORY_CARE_PROVIDER_SITE_OTHER): Payer: Managed Care, Other (non HMO) | Admitting: Internal Medicine

## 2018-03-01 ENCOUNTER — Encounter: Payer: Self-pay | Admitting: Internal Medicine

## 2018-03-01 VITALS — BP 120/76 | HR 72 | Ht 65.0 in | Wt 210.2 lb

## 2018-03-01 DIAGNOSIS — K641 Second degree hemorrhoids: Secondary | ICD-10-CM | POA: Insufficient documentation

## 2018-03-01 DIAGNOSIS — K648 Other hemorrhoids: Secondary | ICD-10-CM

## 2018-03-01 HISTORY — DX: Second degree hemorrhoids: K64.1

## 2018-03-01 NOTE — Progress Notes (Signed)
  HEMORRHOID LIGATION  Hemorrhoids identified at colonoscopy 02/19/18 and banding offered.  Sxs: bleeding, prolapse.  Has remote hx banding in past.   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  All risks, benefits and alternative forms of therapy were described and informed consent was obtained.   The anorectum was pre-medicated with 0.125% NTG and 5% lidocaine The decision was made to band the all 3 columns of internal hemorrhoids, and the [place] was used to perform band ligation without complication.  Digital anorectal examination was then performed to assure proper positioning of the band, and to adjust the banded tissue as required.  The patient was discharged home without pain or other issues.  Dietary and behavioral recommendations were given and along with follow-up instructions.      The patient will return in about 2 months for  follow-up and possible additional banding as required. No complications were encountered and the patient tolerated the procedure well.  I appreciate the opportunity to care for you. Cc:Fry, Ishmael Holter, MD

## 2018-03-01 NOTE — Patient Instructions (Signed)
  HEMORRHOID BANDING PROCEDURE    FOLLOW-UP CARE   1. The procedure you have had should have been relatively painless since the banding of the area involved does not have nerve endings and there is no pain sensation.  The rubber band cuts off the blood supply to the hemorrhoid and the band may fall off as soon as 48 hours after the banding (the band may occasionally be seen in the toilet bowl following a bowel movement). You may notice a temporary feeling of fullness in the rectum which should respond adequately to plain Tylenol or Motrin.  2. Following the banding, avoid strenuous exercise that evening and resume full activity the next day.  A sitz bath (soaking in a warm tub) or bidet is soothing, and can be useful for cleansing the area after bowel movements.     3. To avoid constipation, take two tablespoons of natural wheat bran, natural oat bran, flax, Benefiber or any over the counter fiber supplement and increase your water intake to 7-8 glasses daily.    4. Unless you have been prescribed anorectal medication, do not put anything inside your rectum for two weeks: No suppositories, enemas, fingers, etc.  5. Occasionally, you may have more bleeding than usual after the banding procedure.  This is often from the untreated hemorrhoids rather than the treated one.  Don't be concerned if there is a tablespoon or so of blood.  If there is more blood than this, lie flat with your bottom higher than your head and apply an ice pack to the area. If the bleeding does not stop within a half an hour or if you feel faint, call our office at [phone] or go to the emergency room.  6. Problems are not common; however, if there is a substantial amount of bleeding, severe pain, chills, fever or difficulty passing urine (very rare) or other problems, you should call us at [phone] or report to the nearest emergency room.  7. Do not stay seated continuously for more than 2-3 hours for a day or  two after the procedure.  Tighten your buttock muscles 10-15 times every two hours and take 10-15 deep breaths every 1-2 hours.  Do not spend more than a few minutes on the toilet if you cannot empty your bowel; instead re-visit the toilet at a later time.    We will see you back for follow up on 05/07/18 at 8:30AM.    I appreciate the opportunity to care for you. Silvano Rusk, MD, [facility]

## 2018-03-02 ENCOUNTER — Ambulatory Visit (INDEPENDENT_AMBULATORY_CARE_PROVIDER_SITE_OTHER): Payer: Managed Care, Other (non HMO) | Admitting: Neurology

## 2018-03-02 ENCOUNTER — Encounter: Payer: Self-pay | Admitting: Internal Medicine

## 2018-03-02 DIAGNOSIS — R202 Paresthesia of skin: Secondary | ICD-10-CM

## 2018-03-02 DIAGNOSIS — R200 Anesthesia of skin: Secondary | ICD-10-CM

## 2018-03-02 DIAGNOSIS — G5601 Carpal tunnel syndrome, right upper limb: Secondary | ICD-10-CM

## 2018-03-02 NOTE — Progress Notes (Signed)
Polyps not hyperplastic 10 year colon recall 2029 My Chart

## 2018-03-02 NOTE — Addendum Note (Signed)
Addended by: Alysia Penna A on: 03/02/2018 01:12 PM   Modules accepted: Orders

## 2018-03-02 NOTE — Procedures (Signed)
[facility]  [place], [place]  [place], [place] [address] Tel: [phone] Fax:  [phone] Test Date:  03/02/2018  Patient: Donald [name] DOB: Jun 17, 1956 Physician: Narda Amber, DO  Sex: Male Height: 5\' 5"  Ref Phys: Alysia Penna, MD  ID#: [id number] Temp: 35.1C Technician:    Patient Complaints: This is a 62 year-old man referred for evaluation of right hand numbness.  NCV & EMG Findings: Extensive electrodiagnostic testing of the right upper extremity shows:  1. Right median sensory response is absent. Right ulnar sensory response is within normal limits. 2. 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 The right ulnar motor responses within normal limits.  3. Chronic motor axon loss changes or eye status to the right abductor pollicis brevis muscle, without accompanied active denervation.   Impression: 1. Right median neuropathy at or distal to the wrist, consistent with the clinical diagnosis of carpal tunnel syndrome. Overall, these findings are severe in degree electrically. 2. 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   ___________________________ Narda Amber, DO    Nerve Conduction Studies Anti Sensory Summary Table   Site NR Peak (ms) Norm Peak (ms) P-T Amp (V) Norm P-T Amp  Right Median Anti Sensory (2nd Digit)  Wrist NR  <3.8  >10  Right Ulnar Anti Sensory (5th Digit)  35.1C  Wrist    2.4 <3.2 25.2 >5   Motor Summary Table   Site NR Onset (ms) Norm Onset (ms) O-P Amp (mV) Norm O-P Amp Site1 Site2 Delta-0 (ms) Dist (cm) Vel (m/s) Norm Vel (m/s)  Right Median Motor (Abd Poll Brev)  35.1C  Wrist    7.7 <4.0 3.9 >5 Elbow Wrist 3.9 26.5 68 >50  Elbow     11.6  3.4  Ulnar-wrist crossover Elbow 7.7 0.0    Ulnar-wrist crossover    3.9  3.0         Right Ulnar Motor (Abd Dig Minimi)  35.1C  Wrist    2.3 <3.1 9.0 >7 B Elbow Wrist 3.5 22.0 63 >50  B Elbow    5.8  8.8  A Elbow B Elbow 1.8 10.0 56 >50  A Elbow    7.6  8.6          EMG   Side Muscle Ins Act Fibs Psw Fasc Number Recrt Dur Dur. Amp Amp. Poly Poly. Comment  Right 1stDorInt Nml Nml Nml Nml Nml Nml Nml Nml Nml Nml Nml Nml N/A  Right Abd Poll Brev Nml Nml Nml Nml 1- Rapid Some 1+ Some 1+ Nml Nml N/A  Right PronatorTeres Nml Nml Nml Nml Nml Nml Nml Nml Nml Nml Nml Nml N/A  Right Biceps Nml Nml Nml Nml Nml Nml Nml Nml Nml Nml Nml Nml N/A  Right Triceps Nml Nml Nml Nml Nml Nml Nml Nml Nml Nml Nml Nml N/A  Right Deltoid Nml Nml Nml Nml Nml Nml Nml Nml Nml Nml Nml Nml N/A      Waveforms:

## 2018-05-07 ENCOUNTER — Encounter: Payer: Self-pay | Admitting: Internal Medicine

## 2018-05-07 ENCOUNTER — Ambulatory Visit (INDEPENDENT_AMBULATORY_CARE_PROVIDER_SITE_OTHER): Payer: Managed Care, Other (non HMO) | Admitting: Internal Medicine

## 2018-05-07 VITALS — BP 152/76 | HR 72 | Ht 63.78 in | Wt 211.0 lb

## 2018-05-07 DIAGNOSIS — K641 Second degree hemorrhoids: Secondary | ICD-10-CM

## 2018-05-07 NOTE — Progress Notes (Signed)
   Donald [name] 62 y.o. [birth year] [id number]  Assessment & Plan:   Prolapsed internal hemorrhoids, grade 2 Much improved F/U prn Hemorrhoid prevention tips provided  I appreciate the opportunity to care for him.  Cc:Fry, Ishmael Holter, MD   Subjective:   Chief Complaint:  HPI Doing well after banding all 3 columns of hemorrhoids 02/2018. Slight blood every 2-3 weeks and no prolapse. Stools reg and no straining  PMH/SH and SHx and FHx all reviewed in EMR   Review of Systems Recent CTS - right  Objective:   Physical Exam BP (!) 152/76 (BP Location: Left Arm, Patient Position: Sitting, Cuff Size: Normal)   Pulse 72   Ht 5' 3.78" (1.62 m) Comment: height measured without shoes  Wt 211 lb (95.7 kg)   BMI 36.47 kg/m  NAD Right wrist splint

## 2018-05-07 NOTE — Patient Instructions (Signed)
   Glad things are so much better.  Here are some tips to keep it that way.  Long Term Prevention of Recurrent Hemorrhoids:   1. Fiber - Western diets are typically deficient in dietary fiber, and the addition of 15 - 20 gm. of fiber will help you have stools of a proper consistency, limiting your need to strain.  In addition to the use of raw oat or wheat bran, there are a number of commercial preparations that are available (Metamucil, Benefiber and Citrucel are just a few).    2. Fluids - It is important to have a sufficient amount of water intake during the day, in part to help the fiber "do its job".  Unless you have a medical condition that would prohibit it, a minimum of 6 - 8 glasses per day is important to help keep a regular bowel movement.  3. Do not strain - Many experts feel that chronic straining is one of the causes for the development of hemorrhoids.  Trying to limit yourself to two minutes on the commode may well limit your risk of recurrent hemorrhoids.  Also, do not try to "hold it" or avoid going to the bathroom when the urge is there.  These behavioral changes are thought to be very helpful in maintaining good bowel health.

## 2018-05-07 NOTE — Assessment & Plan Note (Signed)
Much improved F/U prn Hemorrhoid prevention tips provided

## 2018-08-02 ENCOUNTER — Encounter: Payer: Self-pay | Admitting: Family Medicine

## 2018-08-02 ENCOUNTER — Ambulatory Visit (INDEPENDENT_AMBULATORY_CARE_PROVIDER_SITE_OTHER): Payer: Managed Care, Other (non HMO) | Admitting: Family Medicine

## 2018-08-02 VITALS — BP 142/76 | HR 75 | Temp 98.6°F | Wt 215.0 lb

## 2018-08-02 DIAGNOSIS — L03011 Cellulitis of right finger: Secondary | ICD-10-CM

## 2018-08-02 MED ORDER — CEPHALEXIN 500 MG PO CAPS: 500.0000 mg | ORAL_CAPSULE | Freq: Three times a day (TID) | ORAL | 0 refills | Status: AC

## 2018-08-02 NOTE — Progress Notes (Signed)
   Subjective:    Patient ID: Donald [name], male    DOB: Jun 20, 1956, 62 y.o.   MRN: [id number]  HPI Here for 3 weeks of swelling and pain at the right 3rd fingernail. He has been able to express some fluid from it. No hx of trauma.   Review of Systems  Constitutional: Negative.   Respiratory: Negative.   Cardiovascular: Negative.   Skin: Positive for wound.       Objective:   Physical Exam  Constitutional: He appears well-developed and well-nourished.  Cardiovascular: Normal rate, regular rhythm, normal heart sounds and intact distal pulses.  Pulmonary/Chest: Effort normal and breath sounds normal.  Skin:  The right 3rd finger is swollen and tender at the nail edge           Assessment & Plan:  Paronychia, treat with Keflex. Use hot Epsom salt soaks prn.  Alysia Penna, MD

## 2018-10-24 ENCOUNTER — Ambulatory Visit (HOSPITAL_COMMUNITY)
Admission: EM | Admit: 2018-10-24 | Discharge: 2018-10-24 | Disposition: A | Discharge status: Home / Self Care | Payer: Managed Care, Other (non HMO) | Attending: Physician Assistant | Admitting: Physician Assistant

## 2018-10-24 ENCOUNTER — Encounter (HOSPITAL_COMMUNITY): Payer: Self-pay

## 2018-10-24 ENCOUNTER — Other Ambulatory Visit: Payer: Self-pay

## 2018-10-24 DIAGNOSIS — R42 Dizziness and giddiness: Secondary | ICD-10-CM | POA: Insufficient documentation

## 2018-10-24 DIAGNOSIS — R197 Diarrhea, unspecified: Secondary | ICD-10-CM | POA: Diagnosis not present

## 2018-10-24 DIAGNOSIS — E119 Type 2 diabetes mellitus without complications: Secondary | ICD-10-CM | POA: Diagnosis not present

## 2018-10-24 DIAGNOSIS — E785 Hyperlipidemia, unspecified: Secondary | ICD-10-CM | POA: Diagnosis not present

## 2018-10-24 DIAGNOSIS — R109 Unspecified abdominal pain: Secondary | ICD-10-CM | POA: Diagnosis not present

## 2018-10-24 DIAGNOSIS — R509 Fever, unspecified: Secondary | ICD-10-CM | POA: Insufficient documentation

## 2018-10-24 DIAGNOSIS — I10 Essential (primary) hypertension: Secondary | ICD-10-CM | POA: Insufficient documentation

## 2018-10-24 MED ORDER — AZITHROMYCIN 250 MG PO TABS: ORAL_TABLET | ORAL | 0 refills | Status: AC

## 2018-10-24 NOTE — ED Provider Notes (Signed)
10/24/2018 3:05 PM   DOB: 1956/07/07 / MRN: [id number]  SUBJECTIVE:  Donald [name] is a 62 y.o. male presenting for diarhea that started 5 days ago.  Assoicates fever near the onet of the illness.  Denies bloody diarrhea.  Has tried nothing.  Reports feeling somewhat dizzy yesterday and today.   He has No Known Allergies.   He  has a past medical history of Arthritis, Diabetes mellitus without complication ([place]), Elevated PSA, Hyperlipidemia, Hypertension, and Prolapsed internal hemorrhoids, grade 2 (03/01/2018).    He  reports that he has never smoked. He has never used smokeless tobacco. He reports that he drinks alcohol. He reports that he does not use drugs. He  has no sexual activity history on file. The patient  has a past surgical history that includes Sigmoidoscopy; Tonsillectomy; Colonoscopy; Knee surgery (Left); External ear surgery; Carpal tunnel release (Right); Nasal fracture surgery; and Hemorrhoid banding (02/2018).  His family history includes Arthritis in his unknown relative; Cancer in his unknown relative; Coronary artery disease in his unknown relative; Hyperlipidemia in his unknown relative.  Review of Systems  Constitutional: Negative for chills, diaphoresis and fever.  Eyes: Negative.   Respiratory: Negative for cough, hemoptysis, sputum production, shortness of breath and wheezing.   Cardiovascular: Negative for chest pain, orthopnea and leg swelling.  Gastrointestinal: Negative for abdominal pain, blood in stool, constipation, diarrhea, heartburn, melena, nausea and vomiting.  Genitourinary: Negative for dysuria, flank pain, frequency, hematuria and urgency.  Skin: Negative for rash.  Neurological: Negative for dizziness, sensory change, speech change, focal weakness and headaches.    OBJECTIVE:  BP 133/71 (BP Location: Right Arm)   Pulse 83   Temp 98.4 F (36.9 C) (Oral)   Resp 16   SpO2 97%   Wt Readings from Last 3 Encounters:  08/02/18 215 lb (97.5 kg)   05/07/18 211 lb (95.7 kg)  03/01/18 210 lb 4 oz (95.4 kg)   Temp Readings from Last 3 Encounters:  10/24/18 98.4 F (36.9 C) (Oral)  08/02/18 98.6 F (37 C) (Oral)  02/19/18 99.1 F (37.3 C)   BP Readings from Last 3 Encounters:  10/24/18 133/71  08/02/18 (!) 142/76  05/07/18 (!) 152/76   Pulse Readings from Last 3 Encounters:  10/24/18 83  08/02/18 75  05/07/18 72    Physical Exam  Constitutional: He is oriented to person, place, and time. He appears well-developed. He does not appear ill.  Eyes: Pupils are equal, round, and reactive to light. Conjunctivae and EOM are normal.  Cardiovascular: Normal rate, regular rhythm, S1 normal, S2 normal, normal heart sounds, intact distal pulses and normal pulses. Exam reveals no gallop and no friction rub.  No murmur heard. Pulmonary/Chest: Effort normal. No stridor. No respiratory distress. He has no wheezes. He has no rales.  Abdominal: Soft. Normal appearance and bowel sounds are normal. He exhibits no distension and no mass. There is no tenderness. There is no rigidity, no rebound, no guarding and no CVA tenderness. No hernia.  Musculoskeletal: Normal range of motion. He exhibits no edema.  Neurological: He is alert and oriented to person, place, and time. No cranial nerve deficit. Coordination normal.  Skin: Skin is warm and dry. He is not diaphoretic.  Psychiatric: He has a normal mood and affect.  Nursing note and vitals reviewed.   No results found for this or any previous visit (from the past 72 hour(s)).  No results found.  ASSESSMENT AND PLAN:   Diarrhea of presumed infectious origin -  Nontoxic and vital signs stable.  Will treat as a traveler's diarrhea.  He will collect a stool sample and bring it back before starting antibiotic therapy.    Discharge Instructions     Please deliver the stool sample anytime today or tomorrow.  Okay to continue the OTC Imodium.        The patient is advised to call or  return to clinic if he does not see an improvement in symptoms, or to seek the care of the closest emergency department if he worsens with the above plan.   Philis Fendt, MHS, PA-C 10/24/2018 3:05 PM   Tereasa Coop, PA-C 10/24/18 1505

## 2018-10-24 NOTE — ED Triage Notes (Signed)
Pt presents today with diarrhea x5 days. States he had a fever on Tuesday of 102 but has not had fever since then. No nausea or vomiting. Some abdominal cramping. Worst case of diarrhea he states he has ever had.

## 2018-10-24 NOTE — Discharge Instructions (Signed)
Please deliver the stool sample anytime today or tomorrow.  Okay to continue the OTC Imodium.

## 2018-10-29 LAB — STOOL CULTURE: E COLI SHIGA TOXIN ASSAY: NEGATIVE

## 2018-10-29 LAB — STOOL CULTURE REFLEX - CMPCXR

## 2018-10-29 LAB — STOOL CULTURE REFLEX - RSASHR

## 2018-12-05 ENCOUNTER — Other Ambulatory Visit: Payer: Self-pay | Admitting: Family Medicine

## 2018-12-08 ENCOUNTER — Other Ambulatory Visit: Payer: Self-pay | Admitting: Family Medicine

## 2018-12-09 ENCOUNTER — Other Ambulatory Visit: Payer: Self-pay | Admitting: Family Medicine

## 2018-12-14 ENCOUNTER — Encounter: Payer: Self-pay | Admitting: Family Medicine

## 2018-12-14 ENCOUNTER — Ambulatory Visit (INDEPENDENT_AMBULATORY_CARE_PROVIDER_SITE_OTHER): Payer: Managed Care, Other (non HMO) | Admitting: Family Medicine

## 2018-12-14 VITALS — BP 140/82 | HR 94 | Temp 99.2°F | Ht 65.0 in | Wt 214.5 lb

## 2018-12-14 DIAGNOSIS — E119 Type 2 diabetes mellitus without complications: Secondary | ICD-10-CM

## 2018-12-14 DIAGNOSIS — G8929 Other chronic pain: Secondary | ICD-10-CM

## 2018-12-14 DIAGNOSIS — Z125 Encounter for screening for malignant neoplasm of prostate: Secondary | ICD-10-CM

## 2018-12-14 DIAGNOSIS — M25561 Pain in right knee: Secondary | ICD-10-CM | POA: Diagnosis not present

## 2018-12-14 DIAGNOSIS — I499 Cardiac arrhythmia, unspecified: Secondary | ICD-10-CM | POA: Diagnosis not present

## 2018-12-14 DIAGNOSIS — M25562 Pain in left knee: Secondary | ICD-10-CM

## 2018-12-14 DIAGNOSIS — Z0000 Encounter for general adult medical examination without abnormal findings: Secondary | ICD-10-CM | POA: Diagnosis not present

## 2018-12-14 LAB — BASIC METABOLIC PANEL
BUN: 18 mg/dL (ref 6–23)
CO2: 24 mEq/L (ref 19–32)
Calcium: 9.9 mg/dL (ref 8.4–10.5)
Chloride: 105 mEq/L (ref 96–112)
Creatinine, Ser: 1.12 mg/dL (ref 0.40–1.50)
GFR: 66.64 mL/min (ref >60.00)
Glucose, Bld: 98 mg/dL (ref 70–99)
Potassium: 4.3 mEq/L (ref 3.5–5.1)
Sodium: 138 mEq/L (ref 135–145)

## 2018-12-14 LAB — TSH: TSH: <0.01 u[IU]/mL — ABNORMAL LOW (ref 0.35–4.50)

## 2018-12-14 LAB — CBC WITH DIFFERENTIAL/PLATELET
Basophils Absolute: 0 10*3/uL (ref 0.0–0.1)
Basophils Relative: 0.5 % (ref 0.0–3.0)
Eosinophils Absolute: 0.1 10*3/uL (ref 0.0–0.7)
Eosinophils Relative: 1.1 % (ref 0.0–5.0)
HCT: 40.3 % (ref 39.0–52.0)
Hemoglobin: 13.6 g/dL (ref 13.0–17.0)
LYMPHS ABS: 1.4 10*3/uL (ref 0.7–4.0)
Lymphocytes Relative: 23.4 % (ref 12.0–46.0)
MCHC: 33.8 g/dL (ref 30.0–36.0)
MCV: 89.7 fl (ref 78.0–100.0)
Monocytes Absolute: 0.5 10*3/uL (ref 0.1–1.0)
Monocytes Relative: 8.4 % (ref 3.0–12.0)
Neutro Abs: 4.1 10*3/uL (ref 1.4–7.7)
Neutrophils Relative %: 66.6 % (ref 43.0–77.0)
Platelets: 212 10*3/uL (ref 150.0–400.0)
RBC: 4.49 Mil/uL (ref 4.22–5.81)
RDW: 12.6 % (ref 11.5–15.5)
WBC: 6.2 10*3/uL (ref 4.0–10.5)

## 2018-12-14 LAB — HEPATIC FUNCTION PANEL
ALBUMIN: 4.3 g/dL (ref 3.5–5.2)
ALT: 40 U/L (ref 0–53)
AST: 25 U/L (ref 0–37)
Alkaline Phosphatase: 60 U/L (ref 39–117)
Bilirubin, Direct: 0.1 mg/dL (ref 0.0–0.3)
TOTAL PROTEIN: 6.6 g/dL (ref 6.0–8.3)
Total Bilirubin: 0.5 mg/dL (ref 0.2–1.2)

## 2018-12-14 LAB — LIPID PANEL
CHOLESTEROL: 196 mg/dL (ref 0–200)
HDL: 24.3 mg/dL — ABNORMAL LOW (ref >39.00)
NonHDL: 172.11
Total CHOL/HDL Ratio: 8
Triglycerides: 361 mg/dL — ABNORMAL HIGH (ref 0.0–149.0)
VLDL: 72.2 mg/dL — ABNORMAL HIGH (ref 0.0–40.0)

## 2018-12-14 LAB — PSA: PSA: 5.84 ng/mL — ABNORMAL HIGH (ref 0.10–4.00)

## 2018-12-14 LAB — LDL CHOLESTEROL, DIRECT: Direct LDL: 122 mg/dL

## 2018-12-14 LAB — HEMOGLOBIN A1C: HEMOGLOBIN A1C: 6.2 % (ref 4.6–6.5)

## 2018-12-14 MED ORDER — DICLOFENAC SODIUM 75 MG PO TBEC: 75.0000 mg | DELAYED_RELEASE_TABLET | Freq: Two times a day (BID) | ORAL | 3 refills | Status: AC

## 2018-12-14 MED ORDER — LISINOPRIL 20 MG PO TABS: 20.0000 mg | ORAL_TABLET | Freq: Every day | ORAL | 3 refills | Status: AC

## 2018-12-14 NOTE — Progress Notes (Signed)
Subjective:    Patient ID: Donald [name], male    DOB: Jul 03, 1956, 63 y.o.   MRN: [id number]  HPI Here for a well exam. His only concern is worsening pain in both knees. He has seen Dr. Noemi Chapel in the past for this, and he was told he had advanced arthritis in them. He takes Diclofenac BID and this helps for a few hours.    Review of Systems  Constitutional: Negative.   HENT: Negative.   Eyes: Negative.   Respiratory: Negative.   Cardiovascular: Negative.   Gastrointestinal: Negative.   Genitourinary: Negative.   Musculoskeletal: Positive for arthralgias.  Skin: Negative.   Neurological: Negative.   Psychiatric/Behavioral: Negative.        Objective:   Physical Exam Constitutional:      General: He is not in acute distress.    Appearance: He is well-developed. He is not diaphoretic.  HENT:     Head: Normocephalic and atraumatic.     Right Ear: External ear normal.     Left Ear: External ear normal.     Nose: Nose normal.     Mouth/Throat:     Pharynx: No oropharyngeal exudate.  Eyes:     General: No scleral icterus.       Right eye: No discharge.        Left eye: No discharge.     Conjunctiva/sclera: Conjunctivae normal.     Pupils: Pupils are equal, round, and reactive to light.  Neck:     Musculoskeletal: Neck supple.     Thyroid: No thyromegaly.     Vascular: No JVD.     Trachea: No tracheal deviation.  Cardiovascular:     Rate and Rhythm: Normal rate.     Heart sounds: Normal heart sounds. No murmur. No friction rub. No gallop.      Comments: Rhythm is mostly regular but with frequent ectopy. EKG shows sinus rhythm with occasional PVCs  Pulmonary:     Effort: Pulmonary effort is normal. No respiratory distress.     Breath sounds: Normal breath sounds. No wheezing or rales.  Chest:     Chest wall: No tenderness.  Abdominal:     General: Bowel sounds are normal. There is no distension.     Palpations: Abdomen is soft. There is no mass.     Tenderness:  There is no abdominal tenderness. There is no guarding or rebound.  Genitourinary:    Penis: Normal. No tenderness.      Prostate: Normal.     Rectum: Normal. Guaiac result negative.  Musculoskeletal: Normal range of motion.        General: No tenderness.  Lymphadenopathy:     Cervical: No cervical adenopathy.  Skin:    General: Skin is warm and dry.     Coloration: Skin is not pale.     Findings: No erythema or rash.  Neurological:     Mental Status: He is alert and oriented to person, place, and time.     Cranial Nerves: No cranial nerve deficit.     Motor: No abnormal muscle tone.     Coordination: Coordination normal.     Deep Tendon Reflexes: Reflexes are normal and symmetric. Reflexes normal.  Psychiatric:        Behavior: Behavior normal.        Thought Content: Thought content normal.        Judgment: Judgment normal.           Assessment & Plan:  Well exam. We discussed diet and exercise. Get fasting labs. Refer back to Dr. Noemi Chapel for the knee pain.  Alysia Penna, MD

## 2018-12-23 ENCOUNTER — Encounter: Payer: Self-pay | Admitting: *Deleted

## 2018-12-23 ENCOUNTER — Other Ambulatory Visit: Payer: Self-pay | Admitting: Family Medicine

## 2018-12-23 DIAGNOSIS — R7989 Other specified abnormal findings of blood chemistry: Secondary | ICD-10-CM

## 2019-01-19 ENCOUNTER — Other Ambulatory Visit: Payer: Self-pay | Admitting: Physician Assistant

## 2019-01-25 ENCOUNTER — Encounter: Payer: Self-pay | Admitting: Physician Assistant

## 2019-01-25 DIAGNOSIS — I493 Ventricular premature depolarization: Secondary | ICD-10-CM

## 2019-01-25 DIAGNOSIS — M1712 Unilateral primary osteoarthritis, left knee: Secondary | ICD-10-CM

## 2019-01-25 HISTORY — DX: Ventricular premature depolarization: I49.3

## 2019-01-25 HISTORY — DX: Unilateral primary osteoarthritis, left knee: M17.12

## 2019-01-25 NOTE — H&P (Addendum)
TOTAL KNEE ADMISSION H&P  Patient is being admitted for left total knee arthroplasty.  Subjective:  Chief Complaint:left knee pain.  HPI: Donald [name], 63 y.o. male, has a history of pain and functional disability in the left knee due to arthritis and has failed non-surgical conservative treatments for greater than 12 weeks to includeNSAID's and/or analgesics, corticosteriod injections, viscosupplementation injections, flexibility and strengthening excercises, supervised PT with diminished ADL's post treatment, use of assistive devices, weight reduction as appropriate and activity modification.  Onset of symptoms was gradual, starting 10 years ago with gradually worsening course since that time. The patient noted prior procedures on the knee to include  arthroscopy and menisectomy on the left knee(s).  Patient currently rates pain in the left knee(s) at 10 out of 10 with activity. Patient has night pain, worsening of pain with activity and weight bearing, pain that interferes with activities of daily living, crepitus and joint swelling.  Patient has evidence of subchondral sclerosis, periarticular osteophytes and joint space narrowing by imaging studies. There is no active infection.  Patient Active Problem List   Diagnosis Date Noted  . Primary localized osteoarthritis of left knee 01/25/2019  . Frequent ventricular premature beats 01/25/2019  . Prolapsed internal hemorrhoids, grade 2 03/01/2018  . Elevated PSA 12/11/2017  . Osteoarthritis 12/11/2017  . HTN (hypertension) 01/01/2017  . Type 2 diabetes mellitus without complications ([place]) 37/90/2409   Past Medical History:  Diagnosis Date  . Arthritis   . Diabetes mellitus without complication ([place])   . Elevated PSA    sees Dr. Louis Meckel   . Frequent ventricular premature beats 01/25/2019  . Hyperlipidemia   . Hypertension   . Primary localized osteoarthritis of left knee 01/25/2019  . Prolapsed internal hemorrhoids, grade 2  03/01/2018    Past Surgical History:  Procedure Laterality Date  . CARPAL TUNNEL RELEASE Right   . COLONOSCOPY  02/19/2018   per Dr. Carlean Purl, benign polyps, repeat in 10 yrs   . EXTERNAL EAR SURGERY     age 13-6  . HEMORRHOID BANDING  02/2018  . KNEE SURGERY Left    arthroscopy per Dr. Noemi Chapel   . NASAL FRACTURE SURGERY    . SIGMOIDOSCOPY    . TONSILLECTOMY      No current facility-administered medications for this encounter.    Current Outpatient Medications  Medication Sig Dispense Refill Last Dose  . ACCU-CHEK SOFTCLIX LANCETS lancets    01/25/2019 at Unknown time  . amLODipine (NORVASC) 5 MG tablet TAKE ONE TABLET BY MOUTH DAILY (Patient taking differently: Take 5 mg by mouth daily. ) 90 tablet 2 01/25/2019 at Unknown time  . diclofenac (VOLTAREN) 75 MG EC tablet Take 1 tablet (75 mg total) by mouth 2 (two) times daily. 180 tablet 3 Past Month at Unknown time  . glipiZIDE (GLUCOTROL) 5 MG tablet TAKE ONE TABLET BY MOUTH TWICE A DAY BEFORE A MEAL (Patient taking differently: Take 5 mg by mouth 2 (two) times daily before a meal. ) 180 tablet 2 01/25/2019 at Unknown time  . lisinopril (PRINIVIL,ZESTRIL) 20 MG tablet Take 1 tablet (20 mg total) by mouth daily. 90 tablet 3 01/25/2019 at Unknown time  . magnesium gluconate (MAGONATE) 500 MG tablet Take 500 mg by mouth daily.   01/25/2019 at Unknown time  . Multiple Vitamin (MULTIVITAMIN WITH MINERALS) TABS tablet Take 1 tablet by mouth daily.   01/25/2019 at Unknown time  . Probiotic Product (DIGESTIVE ADVANTAGE PO) Take 2 tablets by mouth 2 (two) times daily.  01/25/2019 at Unknown time   No Known Allergies  Social History   Tobacco Use  . Smoking status: Never Smoker  . Smokeless tobacco: Never Used  Substance Use Topics  . Alcohol use: Yes    Comment: rarely    Family History  Problem Relation Age of Onset  . Arthritis Other   . Cancer Other        breast & lung  . Coronary artery disease Other   . Hyperlipidemia Other   . Colon  cancer Neg Hx   . Colon polyps Neg Hx   . Esophageal cancer Neg Hx   . Rectal cancer Neg Hx   . Stomach cancer Neg Hx      Review of Systems  Constitutional: Negative.   HENT: Negative.   Eyes: Negative.   Respiratory: Negative.   Cardiovascular: Negative.   Gastrointestinal: Negative.   Genitourinary: Negative.   Musculoskeletal: Positive for back pain and joint pain.  Skin: Negative.   Neurological: Negative.   Endo/Heme/Allergies: Negative.   Psychiatric/Behavioral: Negative.     Objective:  Physical Exam  Constitutional: He is oriented to person, place, and time. He appears well-developed and well-nourished.  HENT:  Head: Normocephalic and atraumatic.  Mouth/Throat: Oropharynx is clear and moist.  Eyes: Pupils are equal, round, and reactive to light. Conjunctivae are normal.  Neck: Neck supple.  Cardiovascular: Normal rate.  Irregular rhythm  Respiratory: Effort normal.  GI: Soft.  Genitourinary:    Genitourinary Comments: Not pertinent to current symptomatology therefore not examined.   Musculoskeletal:     Comments: Examination of his left knee reveals pain medially and laterally.  1+ crepitation.  1+ synovitis.  Range of motion -5-120 degrees.  Mild varus deformity.  Knee is stable to ligamentous exam with normal patella tracking.  Exam of the right knee reveals full range of motion without pain, swelling, weakness or instability.    Neurological: He is alert and oriented to person, place, and time.  Skin: Skin is warm and dry.  Psychiatric: He has a normal mood and affect. His behavior is normal.    Vital signs in last 24 hours: Temp:  [97.8 F (36.6 C)] 97.8 F (36.6 C) (03/10 1400) Pulse Rate:  [106] 106 (03/10 1400) BP: (171)/(88) 171/88 (03/10 1400) SpO2:  [97 %] 97 % (03/10 1400) Weight:  [91.6 kg] 91.6 kg (03/10 1400)  Labs:   Estimated body mass index is 33.61 kg/m as calculated from the following:   Height as of this encounter: 5\' 5"  (1.651  m).   Weight as of this encounter: 91.6 kg.   Imaging Review Plain radiographs demonstrate severe degenerative joint disease of the left knee(s). The overall alignment issignificant varus. The bone quality appears to be good for age and reported activity level.      Assessment/Plan:  End stage arthritis, left knee  Principal Problem:   Primary localized osteoarthritis of left knee Active Problems:   HTN (hypertension)   Type 2 diabetes mellitus without complications ([facility])   Elevated PSA   Osteoarthritis   Prolapsed internal hemorrhoids, grade 2   Frequent ventricular premature beats   The patient history, physical examination, clinical judgment of the provider and imaging studies are consistent with end stage degenerative joint disease of the left knee(s) and total knee arthroplasty is deemed medically necessary. The treatment options including medical management, injection therapy arthroscopy and arthroplasty were discussed at length. The risks and benefits of total knee arthroplasty were presented and reviewed. The risks due  to aseptic loosening, infection, stiffness, patella tracking problems, thromboembolic complications and other imponderables were discussed. The patient acknowledged the explanation, agreed to proceed with the plan and consent was signed. Patient is being admitted for inpatient treatment for surgery, pain control, PT, OT, prophylactic antibiotics, VTE prophylaxis, progressive ambulation and ADL's and discharge planning. The patient is planning to be discharged home with home health services     Patient's anticipated LOS is less than 2 midnights, meeting these requirements: - Younger than 62 - Lives within 1 hour of care - Has a competent adult at home to recover with post-op recover - NO history of  - Chronic pain requiring opiods  - Diabetes  - Coronary Artery Disease  - Heart failure  - Heart attack  - Stroke  - DVT/VTE  - Cardiac arrhythmia  -  Respiratory Failure/COPD  - Renal failure  - Anemia  - Advanced Liver disease

## 2019-01-26 NOTE — Patient Instructions (Signed)
Donald [name]  01/26/2019   Your procedure is scheduled on: 02-07-19    Report to [facility] Main  Entrance    Report to Admitting at 7:28 AM    Call this number if you have problems the morning of surgery [phone]    Remember: Do not eat food or drink liquids :After Midnight.    BRUSH YOUR TEETH MORNING OF SURGERY AND RINSE YOUR MOUTH OUT, NO CHEWING GUM CANDY OR MINTS.     Take these medicines the morning of surgery with A SIP OF WATER:Amlodipine (Norvasc)   DO NOT TAKE ANY DIABETIC MEDICATIONS DAY OF YOUR SURGERY                               You may not have any metal on your body including hair pins and              piercings  Do not wear jewelry, make-up, lotions, powders or perfumes, deodorant             Men may shave face and neck.   Do not bring valuables to the hospital. [place].  Contacts, dentures or bridgework may not be worn into surgery.  Leave suitcase in the car. After surgery it may be brought to your room.     Patients discharged the day of surgery will not be allowed to drive home. IF YOU ARE HAVING SURGERY AND GOING HOME THE SAME DAY, YOU MUST HAVE AN ADULT TO DRIVE YOU HOME AND BE WITH YOU FOR 24 HOURS. YOU MAY GO HOME BY TAXI OR UBER OR ORTHERWISE, BUT AN ADULT MUST ACCOMPANY YOU HOME AND STAY WITH YOU FOR 24 HOURS.   Special Instructions: N/A              Please read over the following fact sheets you were given: _____________________________________________________________________ How to Manage Your Diabetes Before and After Surgery  Why is it important to control my blood sugar before and after surgery? . Improving blood sugar levels before and after surgery helps healing and can limit problems. . A way of improving blood sugar control is eating a healthy diet by: o  Eating less sugar and carbohydrates o  Increasing activity/exercise o  Talking with your doctor about  reaching your blood sugar goals . High blood sugars (greater than 180 mg/dL) can raise your risk of infections and slow your recovery, so you will need to focus on controlling your diabetes during the weeks before surgery. . Make sure that the doctor who takes care of your diabetes knows about your planned surgery including the date and location.  How do I manage my blood sugar before surgery? . Check your blood sugar at least 4 times a day, starting 2 days before surgery, to make sure that the level is not too high or low. o Check your blood sugar the morning of your surgery when you wake up and every 2 hours until you get to the Short Stay unit. . If your blood sugar is less than 70 mg/dL, you will need to treat for low blood sugar: o Do not take insulin. o Treat a low blood sugar (less than 70 mg/dL) with  cup of clear juice (cranberry or  apple), 4 glucose tablets, OR glucose gel. o Recheck blood sugar in 15 minutes after treatment (to make sure it is greater than 70 mg/dL). If your blood sugar is not greater than 70 mg/dL on recheck, call [phone] for further instructions. . Report your blood sugar to the short stay nurse when you get to Short Stay.  . If you are admitted to the hospital after surgery: o Your blood sugar will be checked by the staff and you will probably be given insulin after surgery (instead of oral diabetes medicines) to make sure you have good blood sugar levels. o The goal for blood sugar control after surgery is 80-180 mg/dL.   WHAT DO I DO ABOUT MY DIABETES MEDICATION?  Marland Kitchen Do not take oral diabetes medicines (pills) the morning of surgery.  . THE DAY BEFORE SURGERY, take only your morning dose of Glipizide              [place] - Preparing for Surgery Before surgery, you can play an important role.  Because skin is not sterile, your skin needs to be as free of germs as possible.  You can reduce the number of germs on your skin by washing with CHG  (chlorahexidine gluconate) soap before surgery.  CHG is an antiseptic cleaner which kills germs and bonds with the skin to continue killing germs even after washing. Please DO NOT use if you have an allergy to CHG or antibacterial soaps.  If your skin becomes reddened/irritated stop using the CHG and inform your nurse when you arrive at Short Stay. Do not shave (including legs and underarms) for at least 48 hours prior to the first CHG shower.  You may shave your face/neck. Please follow these instructions carefully:  1.  Shower with CHG Soap the night before surgery and the  morning of Surgery.  2.  If you choose to wash your hair, wash your hair first as usual with your  normal  shampoo.  3.  After you shampoo, rinse your hair and body thoroughly to remove the  shampoo.                           4.  Use CHG as you would any other liquid soap.  You can apply chg directly  to the skin and wash                       Gently with a scrungie or clean washcloth.  5.  Apply the CHG Soap to your body ONLY FROM THE NECK DOWN.   Do not use on face/ open                           Wound or open sores. Avoid contact with eyes, ears mouth and genitals (private parts).                       Wash face,  Genitals (private parts) with your normal soap.             6.  Wash thoroughly, paying special attention to the area where your surgery  will be performed.  7.  Thoroughly rinse your body with warm water from the neck down.  8.  DO NOT shower/wash with your normal soap after using and rinsing off  the CHG Soap.  9.  Pat yourself dry with a clean towel.            10.  Wear clean pajamas.            11.  Place clean sheets on your bed the night of your first shower and do not  sleep with pets. Day of Surgery : Do not apply any lotions/deodorants the morning of surgery.  Please wear clean clothes to the hospital/surgery center.  FAILURE TO FOLLOW THESE INSTRUCTIONS MAY RESULT IN THE CANCELLATION OF  YOUR SURGERY PATIENT SIGNATURE_________________________________  NURSE SIGNATURE__________________________________  ________________________________________________________________________   [name]  An incentive spirometer is a tool that can help keep your lungs clear and active. This tool measures how well you are filling your lungs with each breath. Taking [place] deep breaths may help reverse or decrease the chance of developing breathing (pulmonary) problems (especially infection) following:  A [place] period of time when you are unable to move or be active. BEFORE THE PROCEDURE   If the spirometer includes an indicator to show your best effort, your nurse or respiratory therapist will set it to a desired goal.  If possible, sit up straight or lean slightly forward. Try not to slouch.  Hold the incentive spirometer in an upright position. INSTRUCTIONS FOR USE  1. Sit on the edge of your bed if possible, or sit up as far as you can in bed or on a chair. 2. Hold the incentive spirometer in an upright position. 3. Breathe out normally. 4. Place the mouthpiece in your mouth and seal your lips tightly around it. 5. Breathe in slowly and as deeply as possible, raising the piston or the ball toward the top of the column. 6. Hold your breath for 3-5 seconds or for as [place] as possible. Allow the piston or ball to fall to the bottom of the column. 7. Remove the mouthpiece from your mouth and breathe out normally. 8. Rest for a few seconds and repeat Steps 1 through 7 at least 10 times every 1-2 hours when you are awake. Take your time and take a few normal breaths between deep breaths. 9. The spirometer may include an indicator to show your best effort. Use the indicator as a goal to work toward during each repetition. 10. After each set of 10 deep breaths, practice coughing to be sure your lungs are clear. If you have an incision (the cut made at the time of surgery), support your  incision when coughing by placing a pillow or rolled up towels firmly against it. Once you are able to get out of bed, walk around indoors and cough well. You may stop using the incentive spirometer when instructed by your caregiver.  RISKS AND COMPLICATIONS  Take your time so you do not get dizzy or light-headed.  If you are in pain, you may need to take or ask for pain medication before doing incentive spirometry. It is harder to take a deep breath if you are having pain. AFTER USE  Rest and breathe slowly and easily.  It can be helpful to keep track of a log of your progress. Your caregiver can provide you with a simple table to help with this. If you are using the spirometer at home, follow these instructions: [place] IF:   You are having difficultly using the spirometer.  You have trouble using the spirometer as often as instructed.  Your pain medication is not giving enough relief while using the spirometer.  You  develop fever of 100.5 F (38.1 C) or higher. SEEK IMMEDIATE MEDICAL CARE IF:   You cough up bloody sputum that had not been present before.  You develop fever of 102 F (38.9 C) or greater.  You develop worsening pain at or near the incision site. MAKE SURE YOU:   Understand these instructions.  Will watch your condition.  Will get help right away if you are not doing well or get worse. Document Released: 03/16/2007 Document Revised: 01/26/2012 Document Reviewed: 05/17/2007 ExitCare Patient Information 2014 Exit[employer], [place].   ________________________________________________________________________  WHAT IS A BLOOD TRANSFUSION? Blood Transfusion Information  A transfusion is the replacement of blood or some of its parts. Blood is made up of multiple cells which provide different functions.  Red blood cells carry oxygen and are used for blood loss replacement.  White blood cells fight against infection.  Platelets control bleeding.  Plasma  helps clot blood.  Other blood products are available for specialized needs, such as hemophilia or other clotting disorders. BEFORE THE TRANSFUSION  Who gives blood for transfusions?   Healthy volunteers who are fully evaluated to make sure their blood is safe. This is blood bank blood. Transfusion therapy is the safest it has ever been in the practice of medicine. Before blood is taken from a donor, a complete history is taken to make sure that person has no history of diseases nor engages in risky social behavior (examples are intravenous drug use or sexual activity with multiple partners). The donor's travel history is screened to minimize risk of transmitting infections, such as malaria. The donated blood is tested for signs of infectious diseases, such as HIV and hepatitis. The blood is then tested to be sure it is compatible with you in order to minimize the chance of a transfusion reaction. If you or a relative donates blood, this is often done in anticipation of surgery and is not appropriate for emergency situations. It takes many days to process the donated blood. RISKS AND COMPLICATIONS Although transfusion therapy is very safe and saves many lives, the main dangers of transfusion include:   Getting an infectious disease.  Developing a transfusion reaction. This is an allergic reaction to something in the blood you were given. Every precaution is taken to prevent this. The decision to have a blood transfusion has been considered carefully by your caregiver before blood is given. Blood is not given unless the benefits outweigh the risks. AFTER THE TRANSFUSION  Right after receiving a blood transfusion, you will usually feel much better and more energetic. This is especially true if your red blood cells have gotten low (anemic). The transfusion raises the level of the red blood cells which carry oxygen, and this usually causes an energy increase.  The nurse administering the transfusion  will monitor you carefully for complications. HOME CARE INSTRUCTIONS  No special instructions are needed after a transfusion. You may find your energy is better. Speak with your caregiver about any limitations on activity for underlying diseases you may have. SEEK MEDICAL CARE IF:   Your condition is not improving after your transfusion.  You develop redness or irritation at the intravenous (IV) site. SEEK IMMEDIATE MEDICAL CARE IF:  Any of the following symptoms occur over the next 12 hours:  Shaking chills.  You have a temperature by mouth above 102 F (38.9 C), not controlled by medicine.  Chest, back, or muscle pain.  People around you feel you are not acting correctly or are confused.  Shortness of  breath or difficulty breathing.  Dizziness and fainting.  You get a rash or develop hives.  You have a decrease in urine output.  Your urine turns a dark color or changes to pink, red, or brown. Any of the following symptoms occur over the next 10 days:  You have a temperature by mouth above 102 F (38.9 C), not controlled by medicine.  Shortness of breath.  Weakness after normal activity.  The white part of the eye turns yellow (jaundice).  You have a decrease in the amount of urine or are urinating less often.  Your urine turns a dark color or changes to pink, red, or brown. Document Released: 10/31/2000 Document Revised: 01/26/2012 Document Reviewed: 06/19/2008 [facility] Patient Information 2014 [place], [place].  _______________________________________________________________________

## 2019-01-27 ENCOUNTER — Encounter (HOSPITAL_COMMUNITY): Payer: Self-pay

## 2019-01-27 ENCOUNTER — Other Ambulatory Visit: Payer: Self-pay

## 2019-01-27 ENCOUNTER — Encounter (HOSPITAL_COMMUNITY)
Admission: RE | Admit: 2019-01-27 | Discharge: 2019-01-27 | Disposition: A | Discharge status: Home / Self Care | Payer: Managed Care, Other (non HMO) | Source: Ambulatory Visit | Attending: Orthopedic Surgery | Admitting: Orthopedic Surgery

## 2019-01-27 DIAGNOSIS — Z01812 Encounter for preprocedural laboratory examination: Secondary | ICD-10-CM | POA: Insufficient documentation

## 2019-01-27 HISTORY — DX: Nausea with vomiting, unspecified: Z98.890

## 2019-01-27 HISTORY — DX: Other specified postprocedural states: R11.2

## 2019-01-27 HISTORY — DX: Adverse effect of unspecified anesthetic, initial encounter: T41.45XA

## 2019-01-27 HISTORY — DX: Other complications of anesthesia, initial encounter: T88.59XA

## 2019-01-27 LAB — COMPREHENSIVE METABOLIC PANEL
ALT: 42 U/L (ref 0–44)
AST: 27 U/L (ref 15–41)
Albumin: 4.3 g/dL (ref 3.5–5.0)
Alkaline Phosphatase: 62 U/L (ref 38–126)
Anion gap: 9 (ref 5–15)
BUN: 26 mg/dL — ABNORMAL HIGH (ref 8–23)
CO2: 22 mmol/L (ref 22–32)
Calcium: 9.8 mg/dL (ref 8.9–10.3)
Chloride: 109 mmol/L (ref 98–111)
Creatinine, Ser: 1.06 mg/dL (ref 0.61–1.24)
GFR calc Af Amer: >60 mL/min (ref >60)
GFR calc non Af Amer: >60 mL/min (ref >60)
GLUCOSE: 112 mg/dL — AB (ref 70–99)
Potassium: 4.2 mmol/L (ref 3.5–5.1)
SODIUM: 140 mmol/L (ref 135–145)
Total Bilirubin: 0.7 mg/dL (ref 0.3–1.2)
Total Protein: 7.4 g/dL (ref 6.5–8.1)

## 2019-01-27 LAB — CBC WITH DIFFERENTIAL/PLATELET
Abs Immature Granulocytes: 0.02 10*3/uL (ref 0.00–0.07)
Basophils Absolute: 0 10*3/uL (ref 0.0–0.1)
Basophils Relative: 0 %
Eosinophils Absolute: 0 10*3/uL (ref 0.0–0.5)
Eosinophils Relative: 1 %
HCT: 40.1 % (ref 39.0–52.0)
Hemoglobin: 12.9 g/dL — ABNORMAL LOW (ref 13.0–17.0)
Immature Granulocytes: 0 %
Lymphocytes Relative: 22 %
Lymphs Abs: 1.2 10*3/uL (ref 0.7–4.0)
MCH: 29.1 pg (ref 26.0–34.0)
MCHC: 32.2 g/dL (ref 30.0–36.0)
MCV: 90.3 fL (ref 80.0–100.0)
Monocytes Absolute: 0.4 10*3/uL (ref 0.1–1.0)
Monocytes Relative: 8 %
NEUTROS PCT: 69 %
Neutro Abs: 3.9 10*3/uL (ref 1.7–7.7)
Platelets: 235 10*3/uL (ref 150–400)
RBC: 4.44 MIL/uL (ref 4.22–5.81)
RDW: 11.8 % (ref 11.5–15.5)
WBC: 5.7 10*3/uL (ref 4.0–10.5)
nRBC: 0 % (ref 0.0–0.2)

## 2019-01-27 LAB — TYPE AND SCREEN
ABO/RH(D): O POS
Antibody Screen: NEGATIVE

## 2019-01-27 LAB — HEMOGLOBIN A1C
Hgb A1c MFr Bld: 6.5 % — ABNORMAL HIGH (ref 4.8–5.6)
MEAN PLASMA GLUCOSE: 139.85 mg/dL

## 2019-01-27 LAB — SURGICAL PCR SCREEN
MRSA, PCR: NEGATIVE
STAPHYLOCOCCUS AUREUS: POSITIVE — AB

## 2019-01-27 LAB — PROTIME-INR
INR: 1 (ref 0.8–1.2)
Prothrombin Time: 13 seconds (ref 11.4–15.2)

## 2019-01-27 LAB — ABO/RH: ABO/RH(D): O POS

## 2019-01-27 LAB — APTT: aPTT: 31 seconds (ref 24–36)

## 2019-01-27 LAB — GLUCOSE, CAPILLARY: Glucose-Capillary: 108 mg/dL — ABNORMAL HIGH (ref 70–99)

## 2019-01-28 LAB — URINE CULTURE: Culture: NO GROWTH

## 2019-01-31 ENCOUNTER — Emergency Department (HOSPITAL_COMMUNITY): Payer: Managed Care, Other (non HMO)

## 2019-01-31 ENCOUNTER — Other Ambulatory Visit: Payer: Self-pay

## 2019-01-31 ENCOUNTER — Encounter (HOSPITAL_COMMUNITY): Payer: Self-pay | Admitting: Emergency Medicine

## 2019-01-31 ENCOUNTER — Inpatient Hospital Stay (HOSPITAL_COMMUNITY)
Admission: EM | Admit: 2019-01-31 | Discharge: 2019-02-16 | DRG: 208 | Disposition: E | Payer: Managed Care, Other (non HMO) | Attending: Pulmonary Disease | Admitting: Pulmonary Disease

## 2019-01-31 ENCOUNTER — Inpatient Hospital Stay (HOSPITAL_COMMUNITY): Payer: Managed Care, Other (non HMO)

## 2019-01-31 ENCOUNTER — Ambulatory Visit (HOSPITAL_COMMUNITY): Admit: 2019-01-31 | Payer: Managed Care, Other (non HMO) | Admitting: Interventional Cardiology

## 2019-01-31 ENCOUNTER — Encounter (HOSPITAL_COMMUNITY): Admission: EM | Disposition: E | Payer: Self-pay | Source: Home / Self Care | Attending: Pulmonary Disease

## 2019-01-31 DIAGNOSIS — E119 Type 2 diabetes mellitus without complications: Secondary | ICD-10-CM | POA: Diagnosis present

## 2019-01-31 DIAGNOSIS — R579 Shock, unspecified: Secondary | ICD-10-CM | POA: Diagnosis present

## 2019-01-31 DIAGNOSIS — R402312 Coma scale, best motor response, none, at arrival to emergency department: Secondary | ICD-10-CM | POA: Diagnosis present

## 2019-01-31 DIAGNOSIS — I2699 Other pulmonary embolism without acute cor pulmonale: Secondary | ICD-10-CM | POA: Diagnosis present

## 2019-01-31 DIAGNOSIS — I10 Essential (primary) hypertension: Secondary | ICD-10-CM | POA: Diagnosis present

## 2019-01-31 DIAGNOSIS — R402212 Coma scale, best verbal response, none, at arrival to emergency department: Secondary | ICD-10-CM | POA: Diagnosis present

## 2019-01-31 DIAGNOSIS — I469 Cardiac arrest, cause unspecified: Secondary | ICD-10-CM | POA: Diagnosis present

## 2019-01-31 DIAGNOSIS — I213 ST elevation (STEMI) myocardial infarction of unspecified site: Secondary | ICD-10-CM | POA: Diagnosis present

## 2019-01-31 DIAGNOSIS — Z7984 Long term (current) use of oral hypoglycemic drugs: Secondary | ICD-10-CM | POA: Diagnosis not present

## 2019-01-31 DIAGNOSIS — G931 Anoxic brain damage, not elsewhere classified: Secondary | ICD-10-CM | POA: Diagnosis present

## 2019-01-31 DIAGNOSIS — Z8249 Family history of ischemic heart disease and other diseases of the circulatory system: Secondary | ICD-10-CM

## 2019-01-31 DIAGNOSIS — M1712 Unilateral primary osteoarthritis, left knee: Secondary | ICD-10-CM | POA: Diagnosis present

## 2019-01-31 DIAGNOSIS — R9431 Abnormal electrocardiogram [ECG] [EKG]: Secondary | ICD-10-CM | POA: Diagnosis not present

## 2019-01-31 DIAGNOSIS — J9601 Acute respiratory failure with hypoxia: Secondary | ICD-10-CM | POA: Diagnosis present

## 2019-01-31 DIAGNOSIS — R402112 Coma scale, eyes open, never, at arrival to emergency department: Secondary | ICD-10-CM | POA: Diagnosis present

## 2019-01-31 DIAGNOSIS — R931 Abnormal findings on diagnostic imaging of heart and coronary circulation: Secondary | ICD-10-CM | POA: Diagnosis not present

## 2019-01-31 LAB — BASIC METABOLIC PANEL
Anion gap: 15 (ref 5–15)
BUN: 17 mg/dL (ref 8–23)
CALCIUM: 8.5 mg/dL — AB (ref 8.9–10.3)
CO2: 13 mmol/L — ABNORMAL LOW (ref 22–32)
Chloride: 110 mmol/L (ref 98–111)
Creatinine, Ser: 1.52 mg/dL — ABNORMAL HIGH (ref 0.61–1.24)
GFR calc Af Amer: 56 mL/min — ABNORMAL LOW (ref >60)
GFR calc non Af Amer: 49 mL/min — ABNORMAL LOW (ref >60)
Glucose, Bld: 387 mg/dL — ABNORMAL HIGH (ref 70–99)
Potassium: 3.6 mmol/L (ref 3.5–5.1)
Sodium: 138 mmol/L (ref 135–145)

## 2019-01-31 LAB — POCT I-STAT EG7
ACID-BASE DEFICIT: 22 mmol/L — AB (ref 0.0–2.0)
Bicarbonate: 12 mmol/L — ABNORMAL LOW (ref 20.0–28.0)
Calcium, Ion: 1.19 mmol/L (ref 1.15–1.40)
HCT: 35 % — ABNORMAL LOW (ref 39.0–52.0)
Hemoglobin: 11.9 g/dL — ABNORMAL LOW (ref 13.0–17.0)
O2 Saturation: 92 %
Potassium: 3.7 mmol/L (ref 3.5–5.1)
Sodium: 139 mmol/L (ref 135–145)
TCO2: 14 mmol/L — ABNORMAL LOW (ref 22–32)
pCO2, Ven: 70.6 mmHg (ref 44.0–60.0)
pH, Ven: 6.838 — CL (ref 7.250–7.430)
pO2, Ven: 114 mmHg — ABNORMAL HIGH (ref 32.0–45.0)

## 2019-01-31 LAB — ECHOCARDIOGRAM LIMITED: Height: 65 in

## 2019-01-31 LAB — CBC
HCT: 39.8 % (ref 39.0–52.0)
Hemoglobin: 12.2 g/dL — ABNORMAL LOW (ref 13.0–17.0)
MCH: 29.7 pg (ref 26.0–34.0)
MCHC: 30.7 g/dL (ref 30.0–36.0)
MCV: 96.8 fL (ref 80.0–100.0)
Platelets: 213 10*3/uL (ref 150–400)
RBC: 4.11 MIL/uL — ABNORMAL LOW (ref 4.22–5.81)
RDW: 11.5 % (ref 11.5–15.5)
WBC: 15 10*3/uL — ABNORMAL HIGH (ref 4.0–10.5)
nRBC: 0 % (ref 0.0–0.2)

## 2019-01-31 LAB — LACTIC ACID, PLASMA: Lactic Acid, Venous: >11 mmol/L (ref 0.5–1.9)

## 2019-01-31 LAB — I-STAT TROPONIN, ED: Troponin i, poc: 0.6 ng/mL (ref 0.00–0.08)

## 2019-01-31 LAB — I-STAT CREATININE, ED: Creatinine, Ser: 1.3 mg/dL — ABNORMAL HIGH (ref 0.61–1.24)

## 2019-01-31 LAB — TRIGLYCERIDES: Triglycerides: 294 mg/dL — ABNORMAL HIGH (ref <150)

## 2019-01-31 SURGERY — LEFT HEART CATH AND CORONARY ANGIOGRAPHY
Anesthesia: LOCAL

## 2019-01-31 MED ORDER — FENTANYL BOLUS VIA INFUSION
50.0000 ug | INTRAVENOUS | Status: DC | PRN
  Filled 2019-01-31: qty 50

## 2019-01-31 MED ORDER — TENECTEPLASE 50 MG IV KIT
50.0000 mg | PACK | Freq: Once | INTRAVENOUS | Status: AC
  Administered 2019-01-31: 50 mg via INTRAVENOUS
  Filled 2019-01-31: qty 10

## 2019-01-31 MED ORDER — MIDAZOLAM HCL 2 MG/2ML IJ SOLN
2.0000 mg | Freq: Once | INTRAMUSCULAR | Status: AC
  Administered 2019-01-31: 2 mg via INTRAVENOUS
  Filled 2019-01-31: qty 2

## 2019-01-31 MED ORDER — FENTANYL 2500MCG IN NS 250ML (10MCG/ML) PREMIX INFUSION
100.0000 ug/h | INTRAVENOUS | Status: DC
  Filled 2019-01-31: qty 250

## 2019-01-31 MED ORDER — EPINEPHRINE PF 1 MG/10ML IJ SOSY
PREFILLED_SYRINGE | INTRAMUSCULAR | Status: AC | PRN
  Administered 2019-01-31 (×5): 1 mg via INTRAVENOUS

## 2019-01-31 MED ORDER — FENTANYL CITRATE (PF) 100 MCG/2ML IJ SOLN
50.0000 ug | Freq: Once | INTRAMUSCULAR | Status: AC
  Administered 2019-01-31: 50 ug via INTRAVENOUS

## 2019-01-31 MED ORDER — FENTANYL CITRATE (PF) 100 MCG/2ML IJ SOLN
INTRAMUSCULAR | Status: AC
  Filled 2019-01-31: qty 2

## 2019-01-31 MED ORDER — PERFLUTREN LIPID MICROSPHERE
4.0000 mL | Freq: Once | INTRAVENOUS | Status: DC
  Filled 2019-01-31: qty 4

## 2019-01-31 MED ORDER — ROCURONIUM BROMIDE 50 MG/5ML IV SOLN
90.0000 mg | Freq: Once | INTRAVENOUS | Status: AC
  Administered 2019-01-31: 90 mg via INTRAVENOUS
  Filled 2019-01-31: qty 9

## 2019-01-31 MED ORDER — CISATRACURIUM BOLUS VIA INFUSION
0.1000 mg/kg | Freq: Once | INTRAVENOUS | Status: DC
  Filled 2019-01-31: qty 9

## 2019-01-31 MED ORDER — FAMOTIDINE 20 MG IN NS 100 ML IVPB: 20.0000 mg | Freq: Two times a day (BID) | INTRAVENOUS | Status: DC

## 2019-01-31 MED ORDER — EPINEPHRINE PF 1 MG/ML IJ SOLN
0.5000 ug/min | INTRAVENOUS | Status: DC
  Administered 2019-01-31: 10 ug/min via INTRAVENOUS
  Filled 2019-01-31: qty 4

## 2019-01-31 MED ORDER — MIDAZOLAM 50MG/50ML (1MG/ML) PREMIX INFUSION
2.0000 mg/h | INTRAVENOUS | Status: DC
  Filled 2019-01-31: qty 50

## 2019-01-31 MED ORDER — SODIUM CHLORIDE 0.9 % IV SOLN: INTRAVENOUS | Status: DC

## 2019-01-31 MED ORDER — PROPOFOL 1000 MG/100ML IV EMUL: 0.0000 ug/kg/min | INTRAVENOUS | Status: DC

## 2019-01-31 MED ORDER — MIDAZOLAM HCL 2 MG/2ML IJ SOLN: 2.0000 mg | INTRAMUSCULAR | Status: DC | PRN

## 2019-01-31 MED ORDER — EPINEPHRINE PF 1 MG/10ML IJ SOSY
PREFILLED_SYRINGE | INTRAMUSCULAR | Status: AC | PRN
  Administered 2019-01-31 (×3): 1 mg via INTRAVENOUS

## 2019-01-31 MED ORDER — SODIUM CHLORIDE 0.9 % IV BOLUS
1000.0000 mL | Freq: Once | INTRAVENOUS | Status: AC
  Administered 2019-01-31: 1000 mL via INTRAVENOUS

## 2019-01-31 MED ORDER — CHLORHEXIDINE GLUCONATE 0.12% ORAL RINSE (MEDLINE KIT): 15.0000 mL | Freq: Two times a day (BID) | OROMUCOSAL | Status: DC

## 2019-01-31 MED ORDER — ARTIFICIAL TEARS OPHTHALMIC OINT: 1.0000 "application " | TOPICAL_OINTMENT | Freq: Three times a day (TID) | OPHTHALMIC | Status: DC

## 2019-01-31 MED ORDER — SODIUM BICARBONATE 8.4 % IV SOLN
INTRAVENOUS | Status: DC
  Administered 2019-01-31: 11:00:00 via INTRAVENOUS
  Filled 2019-01-31 (×3): qty 150

## 2019-01-31 MED ORDER — MIDAZOLAM HCL 2 MG/2ML IJ SOLN: 2.0000 mg | Freq: Once | INTRAMUSCULAR | Status: DC

## 2019-01-31 MED ORDER — FENTANYL 2500MCG IN NS 250ML (10MCG/ML) PREMIX INFUSION: 25.0000 ug/h | INTRAVENOUS | Status: DC

## 2019-01-31 MED ORDER — NOREPINEPHRINE BITARTRATE 1 MG/ML IV SOLN
INTRAVENOUS | Status: AC | PRN
  Administered 2019-01-31: 10 ug/kg/min via INTRAVENOUS

## 2019-01-31 MED ORDER — NOREPINEPHRINE 4 MG/250ML-% IV SOLN
0.0000 ug/min | INTRAVENOUS | Status: DC
  Administered 2019-01-31: 20 ug/min via INTRAVENOUS

## 2019-01-31 MED ORDER — SODIUM CHLORIDE 0.9 % IV SOLN
1.0000 ug/kg/min | INTRAVENOUS | Status: DC
  Filled 2019-01-31: qty 20

## 2019-01-31 MED ORDER — FENTANYL CITRATE (PF) 100 MCG/2ML IJ SOLN: 100.0000 ug | Freq: Once | INTRAMUSCULAR | Status: DC

## 2019-01-31 MED ORDER — SODIUM CHLORIDE 0.9 % IV SOLN: INTRAVENOUS | Status: DC | PRN

## 2019-01-31 MED ORDER — NOREPINEPHRINE 4 MG/250ML-% IV SOLN
0.0000 ug/min | INTRAVENOUS | Status: DC
  Filled 2019-01-31: qty 250

## 2019-01-31 MED ORDER — SODIUM BICARBONATE 8.4 % IV SOLN
INTRAVENOUS | Status: AC | PRN
  Administered 2019-01-31: 100 meq via INTRAVENOUS
  Administered 2019-01-31: 200 meq via INTRAVENOUS

## 2019-01-31 MED ORDER — ORAL CARE MOUTH RINSE: 15.0000 mL | OROMUCOSAL | Status: DC

## 2019-01-31 MED ORDER — MIDAZOLAM BOLUS VIA INFUSION
2.0000 mg | INTRAVENOUS | Status: DC | PRN
  Filled 2019-01-31: qty 2

## 2019-01-31 MED ORDER — PERFLUTREN LIPID MICROSPHERE
4.0000 mL | Freq: Once | INTRAVENOUS | Status: AC
  Administered 2019-01-31: 4 mL via INTRAVENOUS

## 2019-01-31 MED ORDER — CISATRACURIUM BOLUS VIA INFUSION
0.0500 mg/kg | INTRAVENOUS | Status: DC | PRN
  Filled 2019-01-31: qty 5

## 2019-02-01 ENCOUNTER — Telehealth: Payer: Self-pay | Admitting: Pulmonary Disease

## 2019-02-01 NOTE — Telephone Encounter (Signed)
02/01/19 Received D/C frp, [facility]. Called Dr. He said send to him at [place]. PWR  02/02/2019 Received D/C signed back from Dr.McQuaid will Call [facility] to pick up. PWR

## 2019-02-02 MED FILL — Norepinephrine Bitartrate IV Soln 1 MG/ML (Base Equivalent): INTRAVENOUS | Qty: 4 | Status: AC

## 2019-02-07 ENCOUNTER — Ambulatory Visit (HOSPITAL_COMMUNITY)
Admission: RE | Admit: 2019-02-07 | Payer: Managed Care, Other (non HMO) | Source: Other Acute Inpatient Hospital | Admitting: Orthopedic Surgery

## 2019-02-07 HISTORY — DX: Unilateral primary osteoarthritis, left knee: M17.12

## 2019-02-07 HISTORY — DX: Ventricular premature depolarization: I49.3

## 2019-02-07 SURGERY — ARTHROPLASTY, KNEE, TOTAL
Anesthesia: Spinal | Laterality: Left

## 2019-02-16 NOTE — Code Documentation (Signed)
No pulse palpated will resume CPR

## 2019-02-16 NOTE — ED Notes (Addendum)
Pts wife and friend who found pt were put in [place]. [place] notified.

## 2019-02-16 NOTE — ED Notes (Signed)
CPR started. No pulse.

## 2019-02-16 NOTE — ED Notes (Signed)
CPR stopped. Pulse palpated.

## 2019-02-16 NOTE — Code Documentation (Signed)
Pulse palpated. CPR stopped.

## 2019-02-16 NOTE — ED Notes (Addendum)
Decide made to make pt DNR. Family at bedside- pt is pulseless. In [facility]. family at bedside. MD aware.

## 2019-02-16 NOTE — Progress Notes (Signed)
I provided pastoral presence with the patient's wife and daughter through scripture reading and prayer as the patient was dying. I provided spiritual and grief support for the patient's family after the patient expired. I received next of kin information from the family and gave it to the nurse. I shared that the Chaplain is available for additional support as needed or requested.    02-10-2019 1200  Clinical Encounter Type  Visited With Patient and family together  Visit Type Social support;ED;Death  Spiritual Encounters  Spiritual Needs Prayer;Emotional;Grief support  Stress Factors  Family Stress Factors Exhausted    Chaplain Dr Redgie Grayer

## 2019-02-16 NOTE — Code Documentation (Signed)
Pulse check: [facility] CPR resumed 

## 2019-02-16 NOTE — Progress Notes (Signed)
I responded to a page from the nurse in the ED to provide spiritual support for the patient's family. I visited the patient in [place] and then the family in the Consult Room. I shared words of encouragement and led in prayer. I remained present with the family and medical staff to provide additional support as needed or requested.    Feb 20, 2019 1051  Clinical Encounter Type  Visited With Family;Patient not available  Visit Type Spiritual support;ED  Referral From Nurse  Consult/Referral To Chaplain  Spiritual Encounters  Spiritual Needs Prayer;Emotional  Stress Factors  Family Stress Factors Exhausted    Chaplain Dr Redgie Grayer

## 2019-02-16 NOTE — ED Notes (Signed)
Faint pulse

## 2019-02-16 NOTE — ED Provider Notes (Signed)
[facility] DEPARTMENT Provider Note   CSN: [id number] Arrival date & time: 2019-03-01  0848    History   Chief Complaint Chief Complaint  Patient presents with  . Cardiac Arrest    HPI Donald [name] is a 63 y.o. male.     Pt presents to the ED today as a code STEMI/full arrest.  The pt collapsed at work today.  Co-workers started CPR right away and attached him to the AED and called 911.  The pt was shocked once by the AED.  When EMS arrived, he was shocked again and given 1 epi.  He had EMS CPR for 10 min.  He was shocked 1 more time and had ROSC.  EMS gave him versed en route due to agitation.  When he arrived here, he lost pulses again.  Pt unable to give any hx.     Past Medical History:  Diagnosis Date  . Arthritis   . Complication of anesthesia   . Diabetes mellitus without complication ([place])   . Elevated PSA    sees Dr. Louis Meckel   . Frequent ventricular premature beats 01/25/2019  . Hyperlipidemia   . Hypertension   . PONV (postoperative nausea and vomiting)   . Primary localized osteoarthritis of left knee 01/25/2019  . Prolapsed internal hemorrhoids, grade 2 03/01/2018    Patient Active Problem List   Diagnosis Date Noted  . Cardiac arrest ([place]) 2019/03/01  . Primary localized osteoarthritis of left knee 01/25/2019  . Frequent ventricular premature beats 01/25/2019  . Prolapsed internal hemorrhoids, grade 2 03/01/2018  . Elevated PSA 12/11/2017  . Osteoarthritis 12/11/2017  . HTN (hypertension) 01/01/2017  . Type 2 diabetes mellitus without complications ([place]) 16/05/3709    Past Surgical History:  Procedure Laterality Date  . CARPAL TUNNEL RELEASE Right   . COLONOSCOPY  02/19/2018   per Dr. Carlean Purl, benign polyps, repeat in 10 yrs   . EXTERNAL EAR SURGERY     age [age]  . HEMORRHOID BANDING  02/2018  . KNEE SURGERY Left    arthroscopy per Dr. Noemi Chapel   . NASAL FRACTURE SURGERY    . SIGMOIDOSCOPY    . TONSILLECTOMY           Home Medications    Prior to Admission medications   Medication Sig Start Date End Date Taking? Authorizing Provider  amLODipine (NORVASC) 5 MG tablet TAKE ONE TABLET BY MOUTH DAILY Patient taking differently: Take 5 mg by mouth daily.  12/07/18  Yes Laurey Morale, MD  glipiZIDE (GLUCOTROL) 5 MG tablet TAKE ONE TABLET BY MOUTH TWICE A DAY BEFORE A MEAL Patient taking differently: Take 5 mg by mouth 2 (two) times daily before a meal.  12/09/18  Yes Laurey Morale, MD  lisinopril (PRINIVIL,ZESTRIL) 20 MG tablet Take 1 tablet (20 mg total) by mouth daily. 12/14/18  Yes Laurey Morale, MD  mupirocin ointment (BACTROBAN) 2 % Place 1 application into the nose daily. For procedure 01/28/19  Yes [provider]  ACCU-CHEK SOFTCLIX LANCETS lancets  12/18/17   [provider]  diclofenac (VOLTAREN) 75 MG EC tablet Take 1 tablet (75 mg total) by mouth 2 (two) times daily. 12/14/18   Laurey Morale, MD  magnesium gluconate (MAGONATE) 500 MG tablet Take 500 mg by mouth daily.    [provider]  Multiple Vitamin (MULTIVITAMIN WITH MINERALS) TABS tablet Take 1 tablet by mouth daily.    [provider]  Probiotic Product (DIGESTIVE ADVANTAGE  PO) Take 2 tablets by mouth 2 (two) times daily.    [provider]    Family History Family History  Problem Relation Age of Onset  . Arthritis Other   . Cancer Other        breast & lung  . Coronary artery disease Other   . Hyperlipidemia Other   . Colon cancer Neg Hx   . Colon polyps Neg Hx   . Esophageal cancer Neg Hx   . Rectal cancer Neg Hx   . Stomach cancer Neg Hx     Social History Social History   Tobacco Use  . Smoking status: Never Smoker  . Smokeless tobacco: Never Used  Substance Use Topics  . Alcohol use: Yes    Comment: rarely  . Drug use: No     Allergies   Patient has no known allergies.   Review of Systems Review of Systems  Unable to perform ROS: Patient unresponsive      Physical Exam Updated Vital Signs BP (!) 68/43   Pulse 62   Resp (!) 0   Ht _0  (1.727 m)   Wt 89 kg   SpO2 (!) 82%   BMI 29.83 kg/m   Physical Exam Vitals signs and nursing note reviewed.  Constitutional:      General: He is in acute distress.  HENT:     Head: Normocephalic and atraumatic.     Comments: Blood in mouth    Right Ear: External ear normal.     Left Ear: External ear normal.     Nose: Nose normal.     Mouth/Throat:     Mouth: Mucous membranes are moist.  Eyes:     Comments: Pupils are unreactive  Neck:     Comments: Pt in c-collar  Cardiovascular:     Comments: No pulse Pulmonary:     Comments: BVM upon arrival.  No spont resp. Abdominal:     General: Abdomen is flat.  Musculoskeletal:        General: No deformity.     Right lower leg: No edema.     Left lower leg: No edema.  Skin:    General: Skin is warm.     Capillary Refill: Capillary refill takes more than 3 seconds.  Neurological:     Mental Status: He is unresponsive.     GCS: GCS eye subscore is 1. GCS verbal subscore is 1. GCS motor subscore is 1.  Psychiatric:     Comments: Unable to assess      ED Treatments / Results  Labs (all labs ordered are listed, but only abnormal results are displayed) Labs Reviewed  BASIC METABOLIC PANEL - Abnormal; Notable for the following components:      Result Value   CO2 13 (*)    Glucose, Bld 387 (*)    Creatinine, Ser 1.52 (*)    Calcium 8.5 (*)    GFR calc non Af Amer 49 (*)    GFR calc Af Amer 56 (*)    All other components within normal limits  CBC - Abnormal; Notable for the following components:   WBC 15.0 (*)    RBC 4.11 (*)    Hemoglobin 12.2 (*)    All other components within normal limits  LACTIC ACID, PLASMA - Abnormal; Notable for the following components:   Lactic Acid, Venous >11.0 (*)    All other components within normal limits  TRIGLYCERIDES - Abnormal; Notable for the following components:   Triglycerides  294 (*)     All other components within normal limits  I-STAT TROPONIN, ED - Abnormal; Notable for the following components:   Troponin i, poc 0.60 (*)    All other components within normal limits  POCT I-STAT EG7 - Abnormal; Notable for the following components:   pH, Ven 6.838 (*)    pCO2, Ven 70.6 (*)    pO2, Ven 114.0 (*)    Bicarbonate 12.0 (*)    TCO2 14 (*)    Acid-base deficit 22.0 (*)    HCT 35.0 (*)    Hemoglobin 11.9 (*)    All other components within normal limits  I-STAT CREATININE, ED - Abnormal; Notable for the following components:   Creatinine, Ser 1.30 (*)    All other components within normal limits  LACTIC ACID, PLASMA  TROPONIN I  TROPONIN I  TROPONIN I  BASIC METABOLIC PANEL  PROTIME-INR  PROTIME-INR  APTT  APTT  APTT  CBC  PROTIME-INR  I-STAT ARTERIAL BLOOD GAS, ED    EKG EKG Interpretation  Date/Time:  Feb 12, 2019 09:00:58 EDT Ventricular Rate:  83 PR Interval:    QRS Duration: 89 QT Interval:  306 QTC Calculation: 360 R Axis:   59 Text Interpretation:  Age not entered, assumed to be  63 years old for purpose of ECG interpretation Sinus rhythm Inferolateral infarct, acute Borderline ST elevation, anterior leads >>> Acute MI <<< No old tracing to compare Confirmed by Isla Pence [phone]) on February 12, 2019 9:34:24 AM Also confirmed by Isla Pence [phone]), editor Hattie Perch ([id number])  on 02/12/19 11:13:28 AM   Radiology Dg Chest Portable 1 View  Result Date: 2019-02-12 CLINICAL DATA:  Central line placement, recent CPR EXAM: PORTABLE CHEST 1 VIEW COMPARISON:  Film from earlier in the same day. FINDINGS: Endotracheal tube is now noted 4 cm above the carina. Gastric catheter extends towards the stomach. Right-sided jugular central line is seen. No pneumothorax is noted. Diffuse patchy opacities are noted throughout both lungs worst in the right upper lobe but overall improved when compared with the prior exam. Mild vascular congestion remains.  IMPRESSION: Improved aeration when compared with the prior exam. New right jugular central line is seen without evidence of pneumothorax. Persistent vascular congestion. Electronically Signed   By: Inez Catalina M.D.   On: 02-12-2019 11:21   Dg Chest Portable 1 View  Result Date: 02-12-19 CLINICAL DATA:  63 year old male status post left total knee arthroplasty EXAM: PORTABLE CHEST 1 VIEW COMPARISON:  None. FINDINGS: The patient is intubated. The tip of the endotracheal tube is 8.7 cm above the carina in the region of the thoracic inlet. A gastric tube is present. The tube can be visualized looping in the fundus before passing off the field of view. Overall inspiratory volumes are low with bilateral upper lung predominant interstitial and airspace opacities. Mild interstitial airspace opacities also present in the perihilar regions. No pneumothorax or pleural effusion. Cardiac and mediastinal contours are within normal limits. Atherosclerotic calcifications present in the transverse aorta. No acute osseous abnormality. IMPRESSION: 1. The endotracheal tube is relatively high overlying the thoracic inlet 8.7 cm above the carina. 2. The gastric tube is visualized in the stomach before passing off the field of view. 3. Bilateral perihilar and upper lung predominant interstitial and airspace opacities favored to reflect volume overload/pulmonary edema. Electronically Signed   By: Jacqulynn Cadet M.D.   On: Feb 12, 2019 09:31    Procedures .Critical Care Performed by: Isla Pence, MD Authorized by: Gilford Raid,  Almyra Free, MD   Critical care provider statement:    Critical care time (minutes):  60   Critical care time was exclusive of:  Separately billable procedures and treating other patients   Critical care was necessary to treat or prevent imminent or life-threatening deterioration of the following conditions:  Cardiac failure and respiratory failure   Critical care was time spent personally by me on the  following activities:  Discussions with consultants, evaluation of patient's response to treatment, examination of patient, ordering and performing treatments and interventions, ordering and review of laboratory studies, ordering and review of radiographic studies, pulse oximetry, re-evaluation of patient's condition, obtaining history from patient or surrogate, review of old charts, development of treatment plan with patient or surrogate and ventilator management Procedure Name: Intubation Date/Time: 22-Feb-2019 10:04 AM Performed by: Isla Pence, MD Pre-anesthesia Checklist: Patient identified and Patient being monitored Oxygen Delivery Method: Ambu bag Preoxygenation: Pre-oxygenation with 100% oxygen Ventilation: Mask ventilation without difficulty Laryngoscope Size: Glidescope and 3 Tube size: 7.5 mm Number of attempts: 1 Placement Confirmation: ETT inserted through vocal cords under direct vision,  Positive ETCO2 and Breath sounds checked- equal and bilateral Tube secured with: ETT holder Dental Injury: Teeth and Oropharynx as per pre-operative assessment       (including critical care time)  Medications Ordered in ED Medications  EPINEPHrine (ADRENALIN) 4 mg in dextrose 5 % 250 mL (0.016 mg/mL) infusion (20 mcg/min Intravenous Rate/Dose Change 2019/02/22 1122)  midazolam (VERSED) injection 2 mg (has no administration in time range)  midazolam (VERSED) injection 2 mg (has no administration in time range)  fentaNYL (SUBLIMAZE) 100 MCG/2ML injection (has no administration in time range)  famotidine (PEPCID) IVPB 20 mg in NS 100 mL IVPB (has no administration in time range)  chlorhexidine gluconate (MEDLINE KIT) (PERIDEX) 0.12 % solution 15 mL (has no administration in time range)  MEDLINE mouth rinse (has no administration in time range)  perflutren lipid microspheres (DEFINITY) IV suspension (has no administration in time range)  sodium bicarbonate 150 mEq in dextrose 5 % 1,000 mL  infusion ( Intravenous New Bag/Given 02-22-19 1107)  0.9 %  sodium chloride infusion (has no administration in time range)  norepinephrine (LEVOPHED) 34m in 2515mpremix infusion (has no administration in time range)  cisatracurium (NIMBEX) bolus via infusion 9 mg (has no administration in time range)    And  cisatracurium (NIMBEX) 200 mg in sodium chloride 0.9 % 200 mL (1 mg/mL) infusion (has no administration in time range)    And  cisatracurium (NIMBEX) bolus via infusion 4.5 mg (has no administration in time range)  artificial tears (LACRILUBE) ophthalmic ointment 1 application (has no administration in time range)  0.9 %  sodium chloride infusion (has no administration in time range)  fentaNYL (SUBLIMAZE) injection 100 mcg (has no administration in time range)  fentaNYL 250074min NS 250m13m0mc59m) infusion-PREMIX (has no administration in time range)  fentaNYL (SUBLIMAZE) bolus via infusion 50 mcg (has no administration in time range)  midazolam (VERSED) injection 2 mg (has no administration in time range)  midazolam (VERSED) 50mg 36mS 50mL (71mml)51memix infusion (has no administration in time range)  midazolam (VERSED) bolus via infusion 2 mg (has no administration in time range)  EPINEPHrine (ADRENALIN) 1 MG/10ML injection (1 mg Intravenous Given 05-14-2019 04-07-20norepinephrine (LEVOPHED) injection ( Intravenous Stopped 05-14-2019 2020/04/07sodium chloride 0.9 % bolus 1,000 mL (0 mLs Intravenous Stopped 05-14-2019 Apr 07, 2020fentaNYL (SUBLIMAZE) injection 50 mcg (50 mcg Intravenous Given 06-27-20202020-04-07  5996)  rocuronium (ZEMURON) injection 90 mg (90 mg Intravenous Given Feb 22, 2019 0955)  midazolam (VERSED) injection 2 mg (2 mg Intravenous Given 2019/02/22 0954)  fentaNYL (SUBLIMAZE) injection 50 mcg (50 mcg Intravenous Given 02-22-19 0954)  EPINEPHrine (ADRENALIN) 1 MG/10ML injection (1 mg Intravenous Given 02/22/19 1127)  sodium bicarbonate injection (100 mEq Intravenous Given February 22, 2019 1105)  perflutren lipid  microspheres (DEFINITY) IV suspension (4 mLs Intravenous Given February 22, 2019 1034)  tenecteplase (TNKASE) injection 50 mg (50 mg Intravenous Given 22-Feb-2019 1054)     Initial Impression / Assessment and Plan / ED Course  I have reviewed the triage vital signs and the nursing notes.  Pertinent labs & imaging results that were available during my care of the patient were reviewed by me and considered in my medical decision making (see chart for details).    CPR restarted upon arrival.  He was given a total of 2 epi and 8 min of CPR.  The pt had a ROSC.  Pt intubated using glide scope and there was good color change and good BS, but O2 sats were remaining low.  Tube was high on CXR, so we pushed it down.  The O2 sats remained low.  I was worried about a air leak, so I decided to reintubate.  Tube reverified and O2 sats remained low.  RT increased PEEP which did help.    Cardiology and STEMI team (Dr. Irish Lack) at bedside.  They will hold off on cath until his resp status is a little more stable.    Dr. Lake Bells (CCM) came to see pt and will admit.     Final Clinical Impressions(s) / ED Diagnoses   Final diagnoses:  Cardiac arrest ([place])  ST elevation myocardial infarction (STEMI), unspecified artery ([place])  Acute respiratory failure with hypoxia [facility])    ED Discharge Orders    None       Isla Pence, MD 2019/02/22 1226

## 2019-02-16 NOTE — Code Documentation (Addendum)
Levophed started and given at 0900 at 56mcg

## 2019-02-16 NOTE — ED Notes (Signed)
Time of death 95. Family at bedside.

## 2019-02-16 NOTE — Code Documentation (Signed)
Pulse check- Return of pulses. Pt being intubated.

## 2019-02-16 NOTE — Consult Note (Addendum)
Cardiology Consultation:   Patient ID: Donald [name] MRN: [id number]; DOB: March 09, 1956  Admit date: 02/08/2019 Date of Consult: 08-Feb-2019  Primary Care Provider: Laurey Morale, MD Primary Cardiologist: No primary care provider on file. new Primary Electrophysiologist:  None    Patient Profile:   Donald [name] is a 63 y.o. male with a hx of diabetes who is being seen today for the evaluation of cardiac arrest at the request of Dr. Gilford Raid.  History of Present Illness:   Mr. Mccombie had a witnessed cardiac arrest while at work today.  CPR was started.  He received defibrillation from an AED.  After EMS arrived, he required another defibrillation.  1 mg of epinephrine was given.  He had CPR prior to arrival.  Upon arrival, he lost pulses again.  He required more CPR.  We were consulted due to the initial ECG showing diffuse ST elevation in the anterior, lateral and inferior leads.  There was ST depression in aVR.  Initially, code STEMI was called.  Once the patient arrested in the emergency room, he stopped there for further management.  Chest x-ray was done showing no pneumothorax.  However, he was still very difficult to oxygenate with oxygen saturations in the 60s and 70s despite high FiO2.  He was hypotensive requiring multiple pressors and doses of epinephrine.  Repeat ECG showed significant improvement in the ST elevation diffusely after the heart rate had been slowed down.  I spoke with Dr. Lake Bells after initially evaluating him.  The plan initially was for CT angios of the chest to look for pulmonary embolism.  Of note, the patient had left knee problems, and was wearing a brace.   We performed a stat echocardiogram to evaluate cardiac function since he was too unstable for a CT angiogram.  I personally reviewed the images and this revealed fairly normal left ventricular function.  The right ventricle appeared dilated.  This was most notable on image 14.  The RV is large and  hypokinetic.  Past Medical History:  Diagnosis Date  . Arthritis   . Complication of anesthesia   . Diabetes mellitus without complication ([place])   . Elevated PSA    sees Dr. Louis Meckel   . Frequent ventricular premature beats 01/25/2019  . Hyperlipidemia   . Hypertension   . PONV (postoperative nausea and vomiting)   . Primary localized osteoarthritis of left knee 01/25/2019  . Prolapsed internal hemorrhoids, grade 2 03/01/2018    Past Surgical History:  Procedure Laterality Date  . CARPAL TUNNEL RELEASE Right   . COLONOSCOPY  02/19/2018   per Dr. Carlean Purl, benign polyps, repeat in 10 yrs   . EXTERNAL EAR SURGERY     age [age]  . HEMORRHOID BANDING  02/2018  . KNEE SURGERY Left    arthroscopy per Dr. Noemi Chapel   . NASAL FRACTURE SURGERY    . SIGMOIDOSCOPY    . TONSILLECTOMY         Inpatient Medications: Scheduled Meds: . artificial tears  1 application Both Eyes O1L  . chlorhexidine gluconate (MEDLINE KIT)  15 mL Mouth Rinse BID  . cisatracurium  0.1 mg/kg Intravenous Once  . famotidine (PEPCID) IV  20 mg Intravenous Q12H  . fentaNYL      . fentaNYL (SUBLIMAZE) injection  100 mcg Intravenous Once  . mouth rinse  15 mL Mouth Rinse 10 times per day  . midazolam  2 mg Intravenous Once  . perflutren lipid microspheres (DEFINITY) IV suspension  4 mL  Intravenous Once   Continuous Infusions: . sodium chloride    . sodium chloride    . cisatracurium (NIMBEX) infusion    . epinephrine 10 mcg/min (02/22/2019 1005)  . fentaNYL infusion INTRAVENOUS    . midazolam    . norepinephrine (LEVOPHED) Adult infusion    . propofol (DIPRIVAN) infusion    .  sodium bicarbonate  infusion 1000 mL     PRN Meds: Place/Maintain arterial line **AND** sodium chloride, cisatracurium **AND** cisatracurium (NIMBEX) infusion **AND** cisatracurium, EPINEPHrine, fentaNYL, midazolam, midazolam, midazolam, sodium bicarbonate  Allergies:   No Known Allergies  Social History:   Social History    Socioeconomic History  . Marital status: Married    Spouse name: Not on file  . Number of children: Not on file  . Years of education: Not on file  . Highest education level: Not on file  Occupational History    Employer: [employer]  . Financial resource strain: Not on file  . Food insecurity:    Worry: Not on file    Inability: Not on file  . Transportation needs:    Medical: Not on file    Non-medical: Not on file  Tobacco Use  . Smoking status: Never Smoker  . Smokeless tobacco: Never Used  Substance and Sexual Activity  . Alcohol use: Yes    Comment: rarely  . Drug use: No  . Sexual activity: Not on file  Lifestyle  . Physical activity:    Days per week: Not on file    Minutes per session: Not on file  . Stress: Not on file  Relationships  . Social connections:    Talks on phone: Not on file    Gets together: Not on file    Attends religious service: Not on file    Active member of club or organization: Not on file    Attends meetings of clubs or organizations: Not on file    Relationship status: Not on file  . Intimate partner violence:    Fear of current or ex partner: Not on file    Emotionally abused: Not on file    Physically abused: Not on file    Forced sexual activity: Not on file  Other Topics Concern  . 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 Arthritis Other   . Cancer Other        breast & lung  . Coronary artery disease Other   . Hyperlipidemia Other   . Colon cancer Neg Hx   . Colon polyps Neg Hx   . Esophageal cancer Neg Hx   . Rectal cancer Neg Hx   . Stomach cancer Neg Hx      ROS:  Please see the history of present illness.  Unable to obtain, patient intubated     Physical Exam/Data:   Vitals:   02-22-19 1024 February 22, 2019  1025 2019-02-22 1030 22-Feb-2019 1045  BP:  (!) 107/51 (!) 94/53 (!) 77/48  Pulse:  (!) 41 (!) 50 (!) 39  Resp:  (!) 25 (!) 31 (!) 31  SpO2:  (!) 72% (!) 67% (!) 69%  Weight: 89.5 kg     Height: '5\' 8"'  (1.727 m)  Intake/Output Summary (Last 24 hours) at 02/06/19 1101 Last data filed at 2019/02/06 0946 Gross per 24 hour  Intake 1000 ml  Output -  Net 1000 ml   Last 3 Weights 02/06/19 01/27/2019 01/25/2019  Weight (lbs) 197 lb 5 oz 197 lb 8 oz 202 lb  Weight (kg) 89.5 kg 89.585 kg 91.627 kg     Body mass index is 30 kg/m.  General:  Well nourished, intubated, sedated HEENT: Blood noted on the back of his head Lymph: no adenopathy Neck: C-collar in place   Cardiac: Distant heart sounds Lungs: Coarse breath sounds to auscultation bilaterally,  Abd: soft, nontender, no hepatomegaly, obese Ext: no edema, mild left leg edema Musculoskeletal:  No deformities, BUE and BLE strength normal and equal Skin: warm and dry  Neuro: Unable to assess Psych: Intubated sedated  EKG:  The EKG was personally reviewed and demonstrates: ECG findings as noted above   Relevant CV Studies: Echo images as noted above  Laboratory Data:  Chemistry Recent Labs  Lab 01/27/19 1358 02/06/2019 0938 2019/02/06 0939 06-Feb-2019 0947  NA 140 138 139  --   K 4.2 3.6 3.7  --   CL 109 110  --   --   CO2 22 13*  --   --   GLUCOSE 112* 387*  --   --   BUN 26* 17  --   --   CREATININE 1.06 1.52*  --  1.30*  CALCIUM 9.8 8.5*  --   --   GFRNONAA >60 49*  --   --   GFRAA >60 56*  --   --   ANIONGAP 9 15  --   --     Recent Labs  Lab 01/27/19 1358  PROT 7.4  ALBUMIN 4.3  AST 27  ALT 42  ALKPHOS 62  BILITOT 0.7   Hematology Recent Labs  Lab 01/27/19 1358 02-06-2019 0938 02/06/2019 0939  WBC 5.7 15.0*  --   RBC 4.44 4.11*  --   HGB 12.9* 12.2* 11.9*  HCT 40.1 39.8 35.0*  MCV 90.3 96.8  --   MCH 29.1 29.7  --   MCHC 32.2 30.7  --   RDW 11.8 11.5  --   PLT 235 213  --    Cardiac EnzymesNo  results for input(s): TROPONINI in the last 168 hours.  Recent Labs  Lab 02-06-19 0937  TROPIPOC 0.60*    BNPNo results for input(s): BNP, PROBNP in the last 168 hours.  DDimer No results for input(s): DDIMER in the last 168 hours.  Radiology/Studies:  Dg Chest Portable 1 View  Result Date: 2019-02-06 CLINICAL DATA:  63 year old male status post left total knee arthroplasty EXAM: PORTABLE CHEST 1 VIEW COMPARISON:  None. FINDINGS: The patient is intubated. The tip of the endotracheal tube is 8.7 cm above the carina in the region of the thoracic inlet. A gastric tube is present. The tube can be visualized looping in the fundus before passing off the field of view. Overall inspiratory volumes are low with bilateral upper lung predominant interstitial and airspace opacities. Mild interstitial airspace opacities also present in the perihilar regions. No pneumothorax or pleural effusion. Cardiac and mediastinal contours are within normal limits. Atherosclerotic calcifications present in the transverse aorta. No acute osseous abnormality. IMPRESSION: 1. The endotracheal tube is relatively high overlying the thoracic inlet 8.7 cm above the carina. 2. The gastric tube is visualized in the stomach before passing off the field of view. 3. Bilateral perihilar and upper lung  predominant interstitial and airspace opacities favored to reflect volume overload/pulmonary edema. Electronically Signed   By: Jacqulynn Cadet M.D.   On: 2019-02-13 09:31    Assessment and Plan:   1. Cardiac arrest: Sinus tachycardia with not a typical pattern of ST elevation for STEMI.  There is no clear reciprocal ST depression.  ST changes also improved as heart rate came down.  The echocardiogram is more suggestive of pulmonary embolism given the dilated RV with hypokinesis.  I discussed this at length with Dr. Lake Bells.  They will plan on thrombolytics and intensive care management.  At this point, we will not plan on cardiac  catheterization.  Will follow.     Critical care time 45 minutes For questions or updates, please contact [place] Please consult [email or web address] for contact info under     Signed, Larae Grooms, MD  13-Feb-2019 11:01 AM

## 2019-02-16 NOTE — Progress Notes (Signed)
Ventilator changes per verbal order of MD

## 2019-02-16 NOTE — Progress Notes (Signed)
[facility] Attending  Full note pending  Called emergently to bedside for severe respiratory failure with hypoxemia, shock.   He developed cardiac arrest here, I directed CPR for 7 minutes, received 2 rounds epinephrine. I adjusted ventilator for worsening respiratory failure: RR 35, PEEP 18, FiO2 100% Added paralytic, but prior delivering these medicines his GCS was 3. Added epinephrine infusion, maximal dose of levophed Added bicarbonate infusion Placed CVL R IJ understerile conditions Had cardiology perform a stat echocardiogram, shows RV dilation, LV function is better than RV, not severe.  Some septal wall motion abnormality. O2 saturation still in 60's, hypotension severe despite.   Will administer TNK emergently for possible PE and STEMI, discussed with cardiology.  Roselie Awkward, MD [place] [facility] Pager: [phone] Cell: [phone] If no response, call [phone]

## 2019-02-16 NOTE — Code Documentation (Signed)
Return of pulses- Family in room.  CCm MD preparing to place Central line

## 2019-02-16 NOTE — ED Notes (Signed)
A Line in place

## 2019-02-16 NOTE — ED Notes (Signed)
Patient to unstable per MD to go over to CT at this time. Will call for bed side ECHO.

## 2019-02-16 NOTE — Code Documentation (Addendum)
No pulse palpated will resume CPR

## 2019-02-16 NOTE — ED Triage Notes (Signed)
See code narrator for triage

## 2019-02-16 NOTE — ED Notes (Signed)
Pt has regained pulses- Family at bedside.  2H made aware pt to unstable to transport at this time per CCM MD.

## 2019-02-16 NOTE — ED Notes (Signed)
Pt nephew brought to [employer]. room

## 2019-02-16 NOTE — Consult Note (Signed)
NAME:  Donald [name], MRN:  [id number], DOB:  Dec 12, 1955, LOS: 0 ADMISSION DATE:  02/09/2019, CONSULTATION DATE:  09-Feb-2019  REFERRING MD:  Dr. Gilford Raid, CHIEF COMPLAINT:  Cardiac arrest    History of present illness   63 y/o male with knee osteoarthritis, hypertension, diabetes came to the emergency department on 02/09/2019 in the setting of a cardiac arrest.  He had an impending total knee replacement over the next few weeks.  His wife says that he had stopped a few of his medicines.  He developed some chest pain followed by a cardiac arrest.  EMS was called.  10 minutes of CPR was performed in the field he came in with a "King" airway.  In the emergency department a standard endotracheal tube was placed, unfortunately he required a another 8 minutes of CPR per the ER team prior to my consultation.  He was found to have evidence of an ST elevation MI.  Cardiology was consulted and they had concern for a pulmonary embolism as his leg was embolized and he was profoundly hypoxemic with only minimal changes on a chest x-ray.  Past Medical History  Osteoarthritis Hypertension Diabetes mellitus  Significant Hospital Events     Consults:  Cardiology  Procedures:  Endotracheal tube March 16 Right IJ CVL March 16  Significant Diagnostic Tests:  Bedside emergent echocardiogram March 16 showed relatively preserved LV function, wall motion abnormality in the septum, dilated RV, windows limited  Micro Data:    Antimicrobials:     Interim history/subjective:  As above  Objective   Blood pressure (!) 68/43, pulse 62, resp. rate (!) 0, height 5\' 8"  (1.727 m), weight 89 kg, SpO2 (!) 82 %.    Vent Mode: PRVC FiO2 (%):  [100 %] 100 % Set Rate:  [16 bmp-35 bmp] 35 bmp Vt Set:  [540 mL] 540 mL PEEP:  [12 cmH20-16 cmH20] 16 cmH20 Plateau Pressure:  [28 cmH20] 28 cmH20   Intake/Output Summary (Last 24 hours) at February 09, 2019 1235 Last data filed at 02/09/2019 0946 Gross per 24 hour   Intake 1000 ml  Output -  Net 1000 ml   Filed Weights   02-09-19 1024 09-Feb-2019 1159  Weight: 89.5 kg 89 kg    Examination: General:  In bed on vent HENT: NCAT ETT in place PULM: CTA B, vent supported breathing CV: RRR, no mgr GI: BS+, soft, nontender Derm: mottled skin MSK: normal bulk and tone, left leg in immobilizer Neuro: GCS 3  Chest x-ray images independently reviewed showing what appears to be a right upper lobe infiltrate  Resolved Hospital Problem list     Assessment & Plan:  Acute respiratory failure with hypoxemia: Full ventilator support  Cardiac arrest: Stat echo showed evidence of RV decline, with left leg immobilized Considering this we think that he may have had an pulmonary embolism Stat tenecteplase was administered Epinephrine drip administered Levophed administered  Acute encephalopathy: 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 Christ Kick Stat echocardiogram showed evidence of relatively preserved LV function, dilated right ventricle  Hospital course: I spent approximately 2-1/2 hours with the patient this morning at bedsid aggressively resuscitating with multiple vasopressors, administering IV fluids, placing an emergent central line, and directing multiple rounds of CPR.  Throughout these efforts we managed to get ventilator synchrony with the use of a paralytic agent but his oxygenation did not improve.  He had multiple rounds of CPR.  We administered tenecteplase for high risk concern for a pulmonary embolism and he did have a transient improvement in his oxygenation but quickly his vasopressor requirement increased and his shock worsened leading to multiple rounds of CPR again.  We had multiple conversations with the patient's family.  Efforts were made to get the patient to the cardiac ICU for  hypothermia management but he eventually went asystolic again.  Time of death per my presence at the bedside is 11:39 AM, this does differ slightly from the time documented by bedside nurses.   Best practice:   Updated family bedside  Labs   CBC: Recent Labs  Lab 01/27/19 1358 2019-02-21 0938 21-Feb-2019 0939  WBC 5.7 15.0*  --   NEUTROABS 3.9  --   --   HGB 12.9* 12.2* 11.9*  HCT 40.1 39.8 35.0*  MCV 90.3 96.8  --   PLT 235 213  --     Basic Metabolic Panel: Recent Labs  Lab 01/27/19 1358 February 21, 2019 0938 2019/02/21 0939 02/21/19 0947  NA 140 138 139  --   K 4.2 3.6 3.7  --   CL 109 110  --   --   CO2 22 13*  --   --   GLUCOSE 112* 387*  --   --   BUN 26* 17  --   --   CREATININE 1.06 1.52*  --  1.30*  CALCIUM 9.8 8.5*  --   --    GFR: Estimated Creatinine Clearance: 64.7 mL/min (A) (by C-G formula based on SCr of 1.3 mg/dL (H)). Recent Labs  Lab 01/27/19 1358 21-Feb-2019 0938 Feb 21, 2019 0939  WBC 5.7 15.0*  --   LATICACIDVEN  --   --  >11.0*    Liver Function Tests: Recent Labs  Lab 01/27/19 1358  AST 27  ALT 42  ALKPHOS 62  BILITOT 0.7  PROT 7.4  ALBUMIN 4.3   No results for input(s): LIPASE, AMYLASE in the last 168 hours. No results for input(s): AMMONIA in the last 168 hours.  ABG    Component Value Date/Time   HCO3 12.0 (L) February 21, 2019 0939   TCO2 14 (L) 2019-02-21 0939   ACIDBASEDEF 22.0 (H) February 21, 2019 0939   O2SAT 92.0 2019-02-21 0939     Coagulation Profile: Recent Labs  Lab 01/27/19 1358  INR 1.0    Cardiac Enzymes: No results for input(s): CKTOTAL, CKMB, CKMBINDEX, TROPONINI in the last 168 hours.  HbA1C: Hgb A1c MFr Bld  Date/Time Value Ref Range Status  01/27/2019 02:07 PM 6.5 (H) 4.8 - 5.6 % Final    Comment:    (NOTE) Pre diabetes:          5.7%-6.4% Diabetes:              >6.4% Glycemic control for   <7.0% adults with diabetes   12/14/2018 11:43 AM 6.2 4.6 - 6.5 % Final    Comment:    Glycemic Control Guidelines for People  with Diabetes:Non Diabetic:  <6%Goal of Therapy: <7%Additional Action Suggested:  >8%     CBG: Recent Labs  Lab 01/27/19 1326  GLUCAP 108*    Review of Systems:   Cannot obtain  Past Medical History  He,  has a past medical history of Arthritis, Complication of anesthesia, Diabetes mellitus without complication ([place]), Elevated PSA, Frequent ventricular premature beats (01/25/2019), Hyperlipidemia, Hypertension, PONV (postoperative nausea and vomiting), Primary localized osteoarthritis of left knee (01/25/2019), and Prolapsed internal hemorrhoids, grade 2 (03/01/2018).  Surgical History    Past Surgical History:  Procedure Laterality Date  . CARPAL TUNNEL RELEASE Right   . COLONOSCOPY  02/19/2018   per Dr. Carlean Purl, benign polyps, repeat in 10 yrs   . EXTERNAL EAR SURGERY     age [age]  . HEMORRHOID BANDING  02/2018  . KNEE SURGERY Left    arthroscopy per Dr. Noemi Chapel   . NASAL FRACTURE SURGERY    . SIGMOIDOSCOPY    . TONSILLECTOMY       Social History   reports that he has never smoked. He has never used smokeless tobacco. He reports current alcohol use. He reports that he does not use drugs.   Family History   His family history includes Arthritis in an other family member; Cancer in an other family member; Coronary artery disease in an other family member; Hyperlipidemia in an other family member. There is no history of Colon cancer, Colon polyps, Esophageal cancer, Rectal cancer, or Stomach cancer.   Allergies No Known Allergies   Home Medications  Prior to Admission medications   Medication Sig Start Date End Date Taking? Authorizing Provider  amLODipine (NORVASC) 5 MG tablet TAKE ONE TABLET BY MOUTH DAILY Patient taking differently: Take 5 mg by mouth daily.  12/07/18  Yes Laurey Morale, MD  glipiZIDE (GLUCOTROL) 5 MG tablet TAKE ONE TABLET BY MOUTH TWICE A DAY BEFORE A MEAL Patient taking differently: Take 5 mg by mouth 2 (two) times daily before a meal.  12/09/18  Yes  Laurey Morale, MD  lisinopril (PRINIVIL,ZESTRIL) 20 MG tablet Take 1 tablet (20 mg total) by mouth daily. 12/14/18  Yes Laurey Morale, MD  mupirocin ointment (BACTROBAN) 2 % Place 1 application into the nose daily. For procedure 01/28/19  Yes [provider]  ACCU-CHEK SOFTCLIX LANCETS lancets  12/18/17   [provider]  diclofenac (VOLTAREN) 75 MG EC tablet Take 1 tablet (75 mg total) by mouth 2 (two) times daily. 12/14/18   Laurey Morale, MD  magnesium gluconate (MAGONATE) 500 MG tablet Take 500 mg by mouth daily.    [provider]  Multiple Vitamin (MULTIVITAMIN WITH MINERALS) TABS tablet Take 1 tablet by mouth daily.    [provider]  Probiotic Product (DIGESTIVE ADVANTAGE PO) Take 2 tablets by mouth 2 (two) times daily.    [provider]     Critical care time: 150 minutes    Roselie Awkward, MD [place] [facility]M Pager: [phone] Cell: [phone] If no response, call [phone]

## 2019-02-16 NOTE — Procedures (Signed)
Central Venous Catheter Insertion Procedure Note [facility] [id number] [birth year]  Procedure: Insertion of Central Venous Catheter Indications: Drug and/or fluid administration  Procedure Details Consent: Unable to obtain consent because of emergent medical necessity. Time Out: Verified patient identification, verified procedure, site/side was marked, verified correct patient position, special equipment/implants available, medications/allergies/relevent history reviewed, required imaging and test results available.  Performed  Maximum sterile technique was used including antiseptics, cap, gloves, gown, hand hygiene, mask and sheet. Skin prep: Chlorhexidine; local anesthetic administered A antimicrobial bonded/coated triple lumen catheter was placed in the right internal jugular vein using the Seldinger technique.  Ultrasound was used to verify the patency of the vein and for real time needle guidance.  Evaluation Blood flow good Complications: No apparent complications Patient did tolerate procedure well. Chest X-ray ordered to verify placement.  CXR: pending.  Simonne Maffucci 02/16/19, 12:34 PM

## 2019-02-16 NOTE — Code Documentation (Signed)
Pt arrived into ER room being bagged- pulse check on arrival to ED pt in PEA- CPR restarted.

## 2019-02-16 NOTE — ED Notes (Signed)
Ice applied to patients groin and underarms

## 2019-02-16 NOTE — Code Documentation (Addendum)
No pulse felt. Pt brady down. CPR started.

## 2019-02-16 NOTE — Code Documentation (Signed)
Attempting to re intubate pt. Cannot get O2 saturation over 60%.

## 2019-02-16 NOTE — Progress Notes (Signed)
  Echocardiogram 2D Echocardiogram has been performed.  STAT  Madelaine Etienne Mar 02, 2019, 10:52 AM

## 2019-02-16 NOTE — Code Documentation (Signed)
Witnessed arrest from work- pt fell and staff applied AED

## 2019-02-16 NOTE — ED Notes (Signed)
Per order by MD pt given sedation mediations in order to adjust vent.

## 2019-02-16 NOTE — Procedures (Signed)
Arterial Catheter Insertion Procedure Note MAURISIO RUDDY [id number] 11-May-1956  Procedure: Insertion of Arterial Catheter  Indications: Blood pressure monitoring  Procedure Details Consent: Unable to obtain consent because of emergent medical necessity. Time Out: Verified patient identification, verified procedure, site/side was marked, verified correct patient position, special equipment/implants available, medications/allergies/relevent history reviewed, required imaging and test results available.  Performed  Maximum sterile technique was used including antiseptics, cap, gloves, gown, hand hygiene and mask. Skin prep: Chlorhexidine; local anesthetic administered 20 gauge catheter was inserted into left radial artery using the Seldinger technique. ULTRASOUND GUIDANCE USED: NO Evaluation Blood flow good; BP tracing good. Complications: No apparent complications.   Phillis Knack [facility] 07-Feb-2019

## 2019-02-16 NOTE — ED Notes (Signed)
Report given to [place]- will transport with RT and EMT

## 2019-02-16 NOTE — ED Notes (Signed)
Placing Central line & ECHO being done at this time.

## 2019-02-16 NOTE — ED Notes (Signed)
Pt re-arrested while in room with MD [facility]- CPR started

## 2019-02-16 DEATH — deceased

## 2019-04-21 IMAGING — DX PORTABLE CHEST - 1 VIEW
1 series · 1 of 1 positions shown · non-contrast
Comparison: None.

CLINICAL DATA: 63-year-old male status post left total knee
arthroplasty

EXAM:
PORTABLE CHEST 1 VIEW

[chest ap]
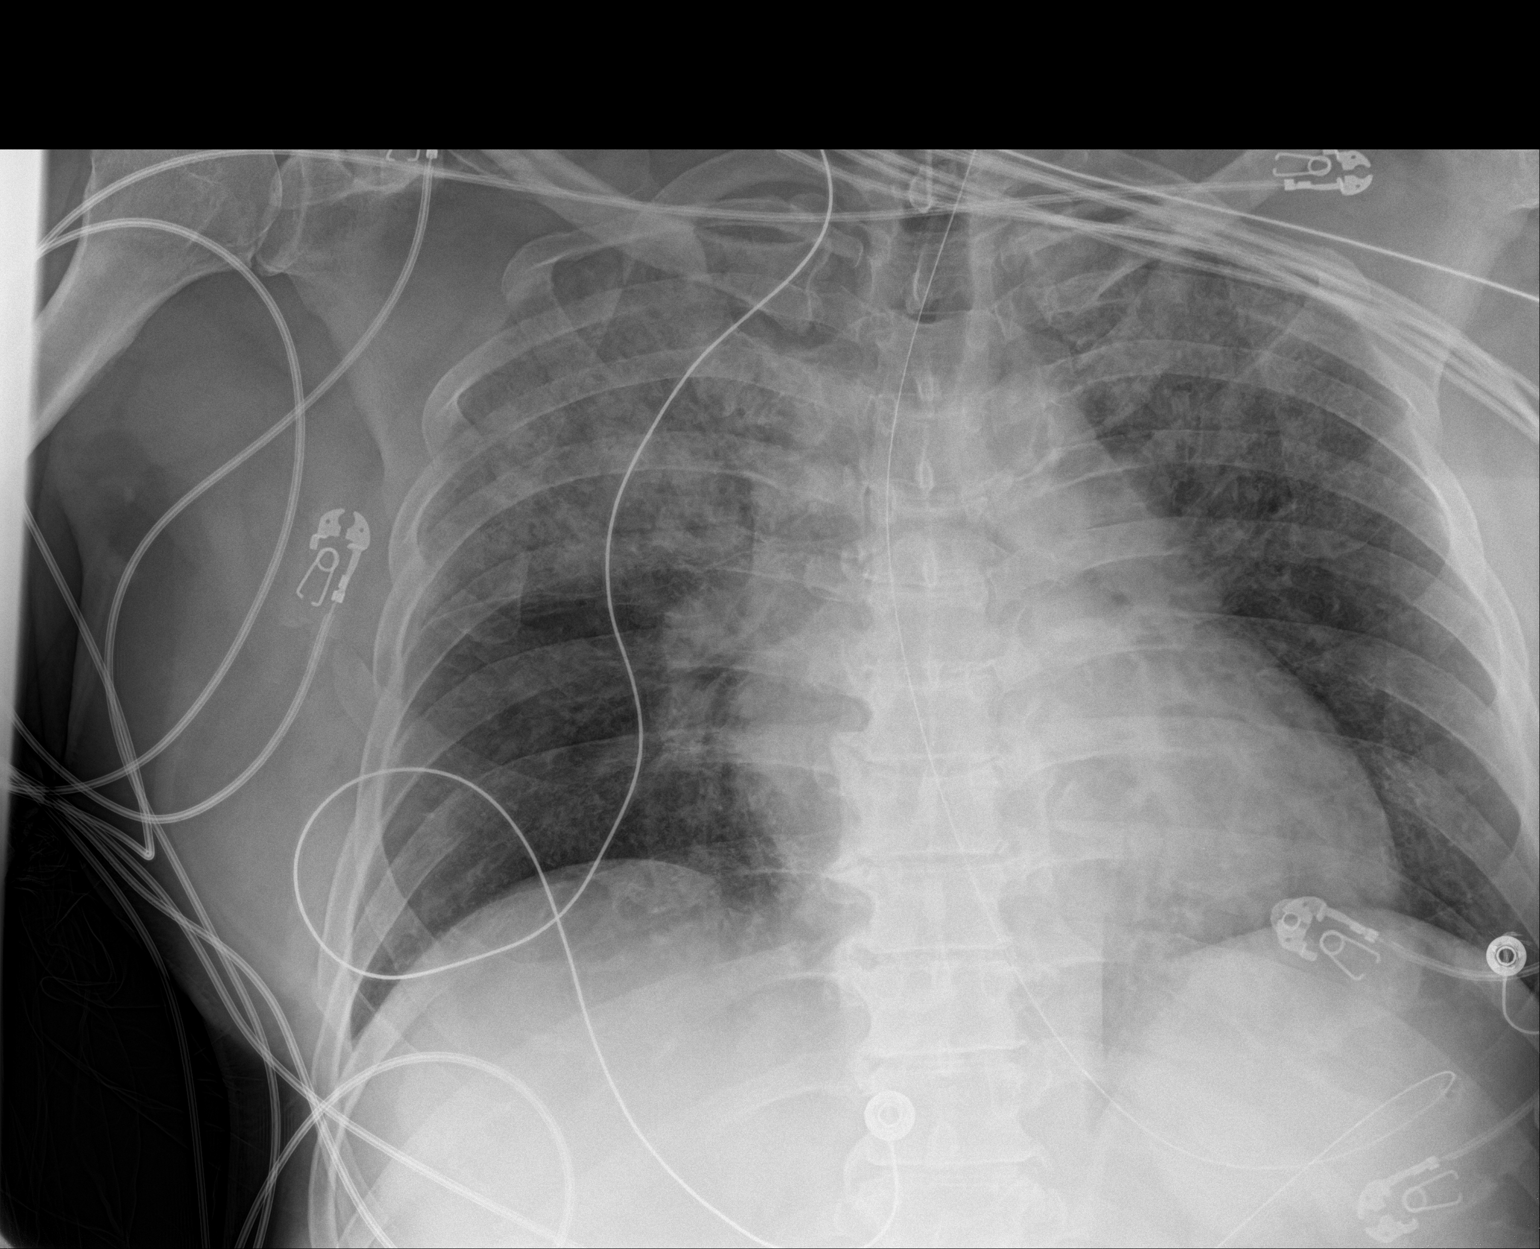

[1 of 1 positions shown; findings below may reference images not displayed]

FINDINGS: The patient is intubated. The tip of the endotracheal tube is 8.7 cm
above the carina in the region of the thoracic inlet. A gastric tube
is present. The tube can be visualized looping in the fundus before
passing off the field of view.

Overall inspiratory volumes are low with bilateral upper lung
predominant interstitial and airspace opacities. Mild interstitial
airspace opacities also present in the perihilar regions. No
pneumothorax or pleural effusion. Cardiac and mediastinal contours
are within normal limits. Atherosclerotic calcifications present in
the transverse aorta. No acute osseous abnormality.
IMPRESSION: 1. The endotracheal tube is relatively high overlying the thoracic
inlet 8.7 cm above the carina.
2. The gastric tube is visualized in the stomach before passing off
the field of view.
3. Bilateral perihilar and upper lung predominant interstitial and
airspace opacities favored to reflect volume overload/pulmonary
edema.
